# Patient Record
Sex: Male | Born: 1970 | Race: White | Hispanic: No | Marital: Married | Smoking: Never smoker
Health system: Southern US, Community
[De-identification: ages and names within clinical notes are randomized; demographics above are authoritative.]

## PROBLEM LIST (undated history)

## (undated) DIAGNOSIS — R030 Elevated blood-pressure reading, without diagnosis of hypertension: Secondary | ICD-10-CM

## (undated) DIAGNOSIS — U071 COVID-19: Secondary | ICD-10-CM

## (undated) DIAGNOSIS — N280 Ischemia and infarction of kidney: Secondary | ICD-10-CM

## (undated) HISTORY — PX: SHOULDER SURGERY: SHX246

## (undated) HISTORY — DX: COVID-19: U07.1

## (undated) HISTORY — DX: Ischemia and infarction of kidney: N28.0

## (undated) HISTORY — DX: Elevated blood-pressure reading, without diagnosis of hypertension: R03.0

---

## 2017-06-12 DIAGNOSIS — Z0000 Encounter for general adult medical examination without abnormal findings: Secondary | ICD-10-CM | POA: Diagnosis not present

## 2017-06-12 DIAGNOSIS — Z13220 Encounter for screening for lipoid disorders: Secondary | ICD-10-CM | POA: Diagnosis not present

## 2017-06-28 ENCOUNTER — Emergency Department (HOSPITAL_COMMUNITY): Payer: 59

## 2017-06-28 ENCOUNTER — Encounter (HOSPITAL_COMMUNITY): Payer: Self-pay | Admitting: *Deleted

## 2017-06-28 ENCOUNTER — Other Ambulatory Visit: Payer: Self-pay

## 2017-06-28 ENCOUNTER — Observation Stay (HOSPITAL_COMMUNITY)
Admission: EM | Admit: 2017-06-28 | Discharge: 2017-06-30 | Disposition: A | Discharge status: Home / Self Care | Payer: 59 | Attending: Family Medicine | Admitting: Family Medicine

## 2017-06-28 DIAGNOSIS — N280 Ischemia and infarction of kidney: Principal | ICD-10-CM | POA: Insufficient documentation

## 2017-06-28 DIAGNOSIS — N182 Chronic kidney disease, stage 2 (mild): Secondary | ICD-10-CM | POA: Diagnosis not present

## 2017-06-28 DIAGNOSIS — I129 Hypertensive chronic kidney disease with stage 1 through stage 4 chronic kidney disease, or unspecified chronic kidney disease: Secondary | ICD-10-CM | POA: Diagnosis not present

## 2017-06-28 DIAGNOSIS — N179 Acute kidney failure, unspecified: Secondary | ICD-10-CM | POA: Diagnosis not present

## 2017-06-28 DIAGNOSIS — R51 Headache: Secondary | ICD-10-CM | POA: Diagnosis not present

## 2017-06-28 DIAGNOSIS — R109 Unspecified abdominal pain: Secondary | ICD-10-CM | POA: Diagnosis not present

## 2017-06-28 DIAGNOSIS — R1012 Left upper quadrant pain: Secondary | ICD-10-CM | POA: Diagnosis not present

## 2017-06-28 DIAGNOSIS — R519 Headache, unspecified: Secondary | ICD-10-CM | POA: Diagnosis present

## 2017-06-28 DIAGNOSIS — R1013 Epigastric pain: Secondary | ICD-10-CM | POA: Diagnosis not present

## 2017-06-28 DIAGNOSIS — Z79899 Other long term (current) drug therapy: Secondary | ICD-10-CM | POA: Insufficient documentation

## 2017-06-28 DIAGNOSIS — E872 Acidosis, unspecified: Secondary | ICD-10-CM | POA: Diagnosis present

## 2017-06-28 LAB — CBC WITH DIFFERENTIAL/PLATELET
ABS IMMATURE GRANULOCYTES: 0 10*3/uL (ref 0.0–0.1)
BASOS PCT: 1 %
Basophils Absolute: 0.1 10*3/uL (ref 0.0–0.1)
Eosinophils Absolute: 0 10*3/uL (ref 0.0–0.7)
Eosinophils Relative: 0 %
HEMATOCRIT: 42.2 % (ref 39.0–52.0)
HEMOGLOBIN: 14 g/dL (ref 13.0–17.0)
Immature Granulocytes: 0 %
LYMPHS ABS: 1.6 10*3/uL (ref 0.7–4.0)
LYMPHS PCT: 16 %
MCH: 29.7 pg (ref 26.0–34.0)
MCHC: 33.2 g/dL (ref 30.0–36.0)
MCV: 89.4 fL (ref 78.0–100.0)
MONO ABS: 1.1 10*3/uL — AB (ref 0.1–1.0)
MONOS PCT: 10 %
Neutro Abs: 7.6 10*3/uL (ref 1.7–7.7)
Neutrophils Relative %: 73 %
Platelets: 270 10*3/uL (ref 150–400)
RBC: 4.72 MIL/uL (ref 4.22–5.81)
RDW: 12.2 % (ref 11.5–15.5)
WBC: 10.4 10*3/uL (ref 4.0–10.5)

## 2017-06-28 LAB — COMPREHENSIVE METABOLIC PANEL
ALK PHOS: 56 U/L (ref 38–126)
ALT: 22 U/L (ref 17–63)
AST: 24 U/L (ref 15–41)
Albumin: 4.5 g/dL (ref 3.5–5.0)
Anion gap: 12 (ref 5–15)
BILIRUBIN TOTAL: 1.5 mg/dL — AB (ref 0.3–1.2)
BUN: 14 mg/dL (ref 6–20)
CALCIUM: 9.8 mg/dL (ref 8.9–10.3)
CO2: 24 mmol/L (ref 22–32)
CREATININE: 1.43 mg/dL — AB (ref 0.61–1.24)
Chloride: 102 mmol/L (ref 101–111)
GFR, EST NON AFRICAN AMERICAN: 57 mL/min — AB (ref >60)
Glucose, Bld: 115 mg/dL — ABNORMAL HIGH (ref 65–99)
Potassium: 3.9 mmol/L (ref 3.5–5.1)
Sodium: 138 mmol/L (ref 135–145)
Total Protein: 7.2 g/dL (ref 6.5–8.1)

## 2017-06-28 LAB — LIPASE, BLOOD: LIPASE: 25 U/L (ref 11–51)

## 2017-06-28 LAB — I-STAT CG4 LACTIC ACID, ED: Lactic Acid, Venous: 2.25 mmol/L (ref 0.5–1.9)

## 2017-06-28 LAB — I-STAT TROPONIN, ED: TROPONIN I, POC: 0 ng/mL (ref 0.00–0.08)

## 2017-06-28 MED ORDER — IOHEXOL 300 MG/ML  SOLN
100.0000 mL | Freq: Once | INTRAMUSCULAR | Status: AC | PRN
  Administered 2017-06-28: 100 mL via INTRAVENOUS

## 2017-06-28 MED ORDER — HYDROMORPHONE HCL 2 MG/ML IJ SOLN
1.0000 mg | Freq: Once | INTRAMUSCULAR | Status: AC
  Administered 2017-06-29: 1 mg via INTRAVENOUS
  Filled 2017-06-28: qty 1

## 2017-06-28 MED ORDER — MORPHINE SULFATE (PF) 4 MG/ML IV SOLN
4.0000 mg | Freq: Once | INTRAVENOUS | Status: AC
  Administered 2017-06-28: 4 mg via INTRAVENOUS
  Filled 2017-06-28: qty 1

## 2017-06-28 MED ORDER — HYDROMORPHONE HCL 2 MG/ML IJ SOLN
0.5000 mg | Freq: Once | INTRAMUSCULAR | Status: AC
  Administered 2017-06-28: 0.5 mg via INTRAVENOUS
  Filled 2017-06-28: qty 1

## 2017-06-28 MED ORDER — ONDANSETRON HCL 4 MG/2ML IJ SOLN
4.0000 mg | Freq: Once | INTRAMUSCULAR | Status: AC
  Administered 2017-06-28: 4 mg via INTRAVENOUS
  Filled 2017-06-28: qty 2

## 2017-06-28 NOTE — ED Triage Notes (Signed)
Left sided abdominal pain and left mid back pain associated with nausea. Pain worse to inspiration.

## 2017-06-28 NOTE — ED Notes (Signed)
Patient transported to CT 

## 2017-06-28 NOTE — ED Provider Notes (Signed)
Patient placed in Quick Look pathway, seen and evaluated   Chief Complaint: Epigastric abdominal pain  HPI:   Patient presents to the ED for evaluation of epigastric and left-sided upper abdominal pain which is been progressively worsening since yesterday has become very constant.  He does report he has developed some pain in the left mid back.  This pain is associated with nausea but no emesis or hematemesis, patient denies any diarrhea, melena or hematochezia.  Patient denies any chest pain or shortness of breath although does report inspiration makes his epigastric pain more severe.  Patient denies fevers or chills, no urinary symptoms.  He reports he was seen by his primary care doctor today who did lab work and gave GI cocktail which initially helped but did not last, but given that his pain was worsening recommended he come to the ED for continued evaluation.  Patient denies any history of previous abdominal surgeries.  Denies regular alcohol use or drug use.  ROS: + Abdominal pain, nausea  Physical Exam:   Gen: Patient appears very uncomfortable on exam  Neuro: Awake and Alert  Skin: Warm    Focused Exam: Focal tenderness over the epigastrium and left upper quadrant with guarding, no lower abdominal tenderness, no CVA tenderness bilaterally, heart with regular rate and rhythm, lungs clear to auscultation   Initiation of care has begun. The patient has been counseled on the process, plan, and necessity for staying for the completion/evaluation, and the remainder of the medical screening examination    Janet Berlin 06/28/17 2109    Blanchie Dessert, MD 06/30/17 2251

## 2017-06-29 ENCOUNTER — Encounter (HOSPITAL_COMMUNITY): Payer: Self-pay | Admitting: Internal Medicine

## 2017-06-29 ENCOUNTER — Observation Stay (HOSPITAL_COMMUNITY): Payer: 59

## 2017-06-29 DIAGNOSIS — N280 Ischemia and infarction of kidney: Secondary | ICD-10-CM | POA: Diagnosis present

## 2017-06-29 DIAGNOSIS — R51 Headache: Secondary | ICD-10-CM | POA: Diagnosis not present

## 2017-06-29 DIAGNOSIS — N179 Acute kidney failure, unspecified: Secondary | ICD-10-CM | POA: Diagnosis not present

## 2017-06-29 DIAGNOSIS — E872 Acidosis, unspecified: Secondary | ICD-10-CM | POA: Diagnosis present

## 2017-06-29 DIAGNOSIS — R519 Headache, unspecified: Secondary | ICD-10-CM | POA: Diagnosis present

## 2017-06-29 DIAGNOSIS — I10 Essential (primary) hypertension: Secondary | ICD-10-CM | POA: Diagnosis not present

## 2017-06-29 LAB — URINALYSIS, ROUTINE W REFLEX MICROSCOPIC
Bilirubin Urine: NEGATIVE
GLUCOSE, UA: NEGATIVE mg/dL
KETONES UR: 80 mg/dL — AB
Leukocytes, UA: NEGATIVE
NITRITE: NEGATIVE
PH: 7 (ref 5.0–8.0)
PROTEIN: NEGATIVE mg/dL
Specific Gravity, Urine: 1.019 (ref 1.005–1.030)

## 2017-06-29 LAB — LACTIC ACID, PLASMA: Lactic Acid, Venous: 0.7 mmol/L (ref 0.5–1.9)

## 2017-06-29 LAB — BASIC METABOLIC PANEL
ANION GAP: 9 (ref 5–15)
BUN: 12 mg/dL (ref 6–20)
CALCIUM: 8.5 mg/dL — AB (ref 8.9–10.3)
CHLORIDE: 103 mmol/L (ref 101–111)
CO2: 24 mmol/L (ref 22–32)
Creatinine, Ser: 1.2 mg/dL (ref 0.61–1.24)
GFR calc non Af Amer: >60 mL/min (ref >60)
Glucose, Bld: 99 mg/dL (ref 65–99)
Potassium: 4 mmol/L (ref 3.5–5.1)
Sodium: 136 mmol/L (ref 135–145)

## 2017-06-29 LAB — CBC
HEMATOCRIT: 37.9 % — AB (ref 39.0–52.0)
Hemoglobin: 12.5 g/dL — ABNORMAL LOW (ref 13.0–17.0)
MCH: 29.5 pg (ref 26.0–34.0)
MCHC: 33 g/dL (ref 30.0–36.0)
MCV: 89.4 fL (ref 78.0–100.0)
PLATELETS: 214 10*3/uL (ref 150–400)
RBC: 4.24 MIL/uL (ref 4.22–5.81)
RDW: 12.3 % (ref 11.5–15.5)
WBC: 9.6 10*3/uL (ref 4.0–10.5)

## 2017-06-29 LAB — RAPID URINE DRUG SCREEN, HOSP PERFORMED
AMPHETAMINES: NOT DETECTED
BENZODIAZEPINES: NOT DETECTED
Barbiturates: NOT DETECTED
COCAINE: NOT DETECTED
OPIATES: POSITIVE — AB
TETRAHYDROCANNABINOL: NOT DETECTED

## 2017-06-29 LAB — PROTIME-INR
INR: 1.04
Prothrombin Time: 13.5 seconds (ref 11.4–15.2)

## 2017-06-29 LAB — SEDIMENTATION RATE: Sed Rate: 11 mm/hr (ref 0–16)

## 2017-06-29 LAB — I-STAT CG4 LACTIC ACID, ED: Lactic Acid, Venous: 0.9 mmol/L (ref 0.5–1.9)

## 2017-06-29 LAB — HIV ANTIBODY (ROUTINE TESTING W REFLEX): HIV Screen 4th Generation wRfx: NONREACTIVE

## 2017-06-29 LAB — HEPARIN LEVEL (UNFRACTIONATED)
Heparin Unfractionated: 0.29 IU/mL — ABNORMAL LOW (ref 0.30–0.70)
Heparin Unfractionated: 0.52 IU/mL (ref 0.30–0.70)

## 2017-06-29 LAB — APTT: APTT: 31 s (ref 24–36)

## 2017-06-29 LAB — TSH: TSH: 1.459 u[IU]/mL (ref 0.350–4.500)

## 2017-06-29 MED ORDER — OXYCODONE-ACETAMINOPHEN 5-325 MG PO TABS
1.0000 | ORAL_TABLET | Freq: Four times a day (QID) | ORAL | Status: DC | PRN
  Administered 2017-06-29 (×2): 2 via ORAL
  Filled 2017-06-29 (×2): qty 2

## 2017-06-29 MED ORDER — OXYCODONE-ACETAMINOPHEN 5-325 MG PO TABS: 1.0000 | ORAL_TABLET | Freq: Four times a day (QID) | ORAL | Status: DC | PRN

## 2017-06-29 MED ORDER — HYDROMORPHONE HCL 1 MG/ML IJ SOLN
1.0000 mg | Freq: Once | INTRAMUSCULAR | Status: AC
  Administered 2017-06-29: 1 mg via INTRAVENOUS
  Filled 2017-06-29: qty 1

## 2017-06-29 MED ORDER — NALOXONE HCL 0.4 MG/ML IJ SOLN: 0.4000 mg | INTRAMUSCULAR | Status: DC | PRN

## 2017-06-29 MED ORDER — HYDROMORPHONE HCL 2 MG/ML IJ SOLN
0.5000 mg | INTRAMUSCULAR | Status: DC | PRN
  Administered 2017-06-29 (×2): 0.5 mg via INTRAVENOUS
  Filled 2017-06-29 (×2): qty 1

## 2017-06-29 MED ORDER — DOCUSATE SODIUM 100 MG PO CAPS
100.0000 mg | ORAL_CAPSULE | Freq: Two times a day (BID) | ORAL | Status: DC
  Administered 2017-06-29 – 2017-06-30 (×3): 100 mg via ORAL
  Filled 2017-06-29 (×3): qty 1

## 2017-06-29 MED ORDER — SODIUM CHLORIDE 0.9 % IV SOLN
INTRAVENOUS | Status: DC
  Administered 2017-06-29 – 2017-06-30 (×2): via INTRAVENOUS

## 2017-06-29 MED ORDER — PANTOPRAZOLE SODIUM 40 MG PO TBEC
40.0000 mg | DELAYED_RELEASE_TABLET | Freq: Every day | ORAL | Status: DC
  Administered 2017-06-29 – 2017-06-30 (×2): 40 mg via ORAL
  Filled 2017-06-29 (×3): qty 1

## 2017-06-29 MED ORDER — HYDROMORPHONE HCL 1 MG/ML IJ SOLN
0.5000 mg | INTRAMUSCULAR | Status: DC | PRN
  Administered 2017-06-29 (×3): 0.5 mg via INTRAVENOUS
  Filled 2017-06-29 (×3): qty 1

## 2017-06-29 MED ORDER — ACETAMINOPHEN 325 MG PO TABS
650.0000 mg | ORAL_TABLET | Freq: Four times a day (QID) | ORAL | Status: DC | PRN
  Filled 2017-06-29: qty 2

## 2017-06-29 MED ORDER — OXYCODONE-ACETAMINOPHEN 5-325 MG PO TABS
1.0000 | ORAL_TABLET | Freq: Four times a day (QID) | ORAL | Status: DC | PRN
  Administered 2017-06-29: 1 via ORAL
  Filled 2017-06-29: qty 1

## 2017-06-29 MED ORDER — ONDANSETRON HCL 4 MG PO TABS: 4.0000 mg | ORAL_TABLET | Freq: Four times a day (QID) | ORAL | Status: DC | PRN

## 2017-06-29 MED ORDER — SUCRALFATE 1 G PO TABS: 1.0000 g | ORAL_TABLET | Freq: Three times a day (TID) | ORAL | Status: DC

## 2017-06-29 MED ORDER — ALBUTEROL SULFATE (2.5 MG/3ML) 0.083% IN NEBU: 2.5000 mg | INHALATION_SOLUTION | RESPIRATORY_TRACT | Status: DC | PRN

## 2017-06-29 MED ORDER — HYDROMORPHONE 1 MG/ML IV SOLN
INTRAVENOUS | Status: DC
  Filled 2017-06-29: qty 25

## 2017-06-29 MED ORDER — SODIUM CHLORIDE 0.9 % IV BOLUS
1000.0000 mL | Freq: Once | INTRAVENOUS | Status: AC
  Administered 2017-06-29: 1000 mL via INTRAVENOUS

## 2017-06-29 MED ORDER — HYDROMORPHONE HCL 1 MG/ML IJ SOLN: 1.0000 mg | INTRAMUSCULAR | Status: DC | PRN

## 2017-06-29 MED ORDER — DIPHENHYDRAMINE HCL 50 MG/ML IJ SOLN: 12.5000 mg | Freq: Four times a day (QID) | INTRAMUSCULAR | Status: DC | PRN

## 2017-06-29 MED ORDER — HEPARIN (PORCINE) IN NACL 100-0.45 UNIT/ML-% IJ SOLN
1600.0000 [IU]/h | INTRAMUSCULAR | Status: AC
  Administered 2017-06-29: 1400 [IU]/h via INTRAVENOUS
  Administered 2017-06-29 – 2017-06-30 (×2): 1600 [IU]/h via INTRAVENOUS
  Filled 2017-06-29: qty 250

## 2017-06-29 MED ORDER — OXYCODONE-ACETAMINOPHEN 5-325 MG PO TABS
1.0000 | ORAL_TABLET | ORAL | Status: DC | PRN
  Administered 2017-06-30: 2 via ORAL
  Filled 2017-06-29: qty 2

## 2017-06-29 MED ORDER — DICYCLOMINE HCL 20 MG PO TABS: 20.0000 mg | ORAL_TABLET | Freq: Three times a day (TID) | ORAL | Status: DC | PRN

## 2017-06-29 MED ORDER — HEPARIN BOLUS VIA INFUSION
4000.0000 [IU] | Freq: Once | INTRAVENOUS | Status: AC
  Administered 2017-06-29: 4000 [IU] via INTRAVENOUS
  Filled 2017-06-29: qty 4000

## 2017-06-29 MED ORDER — DIPHENHYDRAMINE HCL 12.5 MG/5ML PO ELIX: 12.5000 mg | ORAL_SOLUTION | Freq: Four times a day (QID) | ORAL | Status: DC | PRN

## 2017-06-29 MED ORDER — ONDANSETRON HCL 4 MG/2ML IJ SOLN
4.0000 mg | Freq: Four times a day (QID) | INTRAMUSCULAR | Status: DC | PRN
  Administered 2017-06-29 (×2): 4 mg via INTRAVENOUS
  Filled 2017-06-29 (×2): qty 2

## 2017-06-29 MED ORDER — HYDRALAZINE HCL 20 MG/ML IJ SOLN: 10.0000 mg | INTRAMUSCULAR | Status: DC | PRN

## 2017-06-29 MED ORDER — ACETAMINOPHEN 650 MG RE SUPP: 650.0000 mg | Freq: Four times a day (QID) | RECTAL | Status: DC | PRN

## 2017-06-29 MED ORDER — SODIUM CHLORIDE 0.9% FLUSH: 9.0000 mL | INTRAVENOUS | Status: DC | PRN

## 2017-06-29 NOTE — ED Provider Notes (Signed)
[facility] EMERGENCY DEPARTMENT Provider Note   CSN: [id number] Arrival date & time: 06/28/17  1937     History   Chief Complaint Chief Complaint  Patient presents with  . Abdominal Pain    HPI Glen Rollins is a 47 y.o. male.  This patient is a 47 year old male with no significant past medical history.  He presents today for evaluation of left-sided abdominal and flank pain that started abruptly earlier this afternoon.  He describes significant discomfort in these areas that causes him to feel nauseated, but has not vomited.  He denies any fevers or chills.  He denies any bowel or bladder complaints.  He was initially seen at urgent care and told he might have reflux or gastritis.  He was prescribed antacids, however his pain has worsened.  He was advised by urgent care to come to the ER for further work-up.  The history is provided by the patient.  Abdominal Pain   This is a new problem. The current episode started 6 to 12 hours ago. The problem occurs constantly. The problem has been rapidly worsening. The pain is associated with an unknown factor. The pain is located in the LUQ (Left flank). The quality of the pain is cramping. The pain is severe. Pertinent negatives include fever, melena, constipation and dysuria. Nothing aggravates the symptoms. Nothing relieves the symptoms.    History reviewed. No pertinent past medical history.  Patient Active Problem List   Diagnosis Date Noted  . 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 SHOULDER SURGERY          Home Medications    Prior to Admission medications   Medication Sig Start Date End Date Taking? Authorizing Provider  dicyclomine (BENTYL) 20 MG tablet Take 20 mg by mouth 3 (three) times daily as needed for spasms.   Yes [provider]  HYDROcodone-acetaminophen (NORCO/VICODIN) 5-325 MG tablet Take 1 tablet by mouth every 6 (six) hours  as needed for moderate pain.   Yes [provider]  omeprazole (PRILOSEC) 20 MG capsule Take 40 mg by mouth daily.   Yes [provider]  sucralfate (CARAFATE) 1 g tablet Take 1 g by mouth 4 (four) times daily.   Yes [provider]    Family History Family History  Problem Relation Age of Onset  . Lupus Mother   . Aortic stenosis Brother   . Arrhythmia Brother        possible VT    Social History Social History   Tobacco Use  . Smoking status: Never Smoker  Substance Use Topics  . Alcohol use: Yes  . Drug use: Never     Allergies   Patient has no known allergies.   Review of Systems Review of Systems  Constitutional: Negative for fever.  Gastrointestinal: Positive for abdominal pain. Negative for constipation and melena.  Genitourinary: Negative for dysuria.  All other systems reviewed and are negative.    Physical Exam Updated Vital Signs BP (!) 163/106 (BP Location: Left Arm)   Pulse 70   Temp 98.4 F (36.9 C) (Oral)   Resp 16   Ht 5\' 10"  (1.778 m)   Wt 90.7 kg (200 lb)   SpO2 97%   BMI 28.70 kg/m   Physical Exam  Constitutional: He is oriented to person, place, and time. He appears well-developed and well-nourished. No distress.  HENT:  Head: Normocephalic and atraumatic.  Mouth/Throat: Oropharynx is clear  and moist.  Neck: Normal range of motion. Neck supple.  Cardiovascular: Normal rate and regular rhythm. Exam reveals no friction rub.  No murmur heard. Pulmonary/Chest: Effort normal and breath sounds normal. No respiratory distress. He has no wheezes. He has no rales.  Abdominal: Soft. Bowel sounds are normal. He exhibits no distension. There is tenderness in the left upper quadrant. There is no rigidity, no rebound and no guarding.  There is tenderness to palpation in the left upper quadrant and left flank.  Musculoskeletal: Normal range of motion. He exhibits no edema.  Neurological: He is alert and oriented to person,  place, and time. Coordination normal.  Skin: Skin is warm and dry. He is not diaphoretic.  Nursing note and vitals reviewed.    ED Treatments / Results  Labs (all labs ordered are listed, but only abnormal results are displayed) Labs Reviewed  CBC WITH DIFFERENTIAL/PLATELET - Abnormal; Notable for the following components:      Result Value   Monocytes Absolute 1.1 (*)    All other components within normal limits  COMPREHENSIVE METABOLIC PANEL - Abnormal; Notable for the following components:   Glucose, Bld 115 (*)    Creatinine, Ser 1.43 (*)    Total Bilirubin 1.5 (*)    GFR calc non Af Amer 57 (*)    All other components within normal limits  RAPID URINE DRUG SCREEN, HOSP PERFORMED - Abnormal; Notable for the following components:   Opiates POSITIVE (*)    All other components within normal limits  I-STAT CG4 LACTIC ACID, ED - Abnormal; Notable for the following components:   Lactic Acid, Venous 2.25 (*)    All other components within normal limits  CULTURE, BLOOD (ROUTINE X 2)  CULTURE, BLOOD (ROUTINE X 2)  LIPASE, BLOOD  PROTIME-INR  APTT  LACTIC ACID, PLASMA  URINALYSIS, ROUTINE W REFLEX MICROSCOPIC  HEPARIN LEVEL (UNFRACTIONATED)  CBC  HIV ANTIBODY (ROUTINE TESTING)  BASIC METABOLIC PANEL  LACTIC ACID, PLASMA  TSH  I-STAT TROPONIN, ED  I-STAT CG4 LACTIC ACID, ED    EKG EKG Interpretation  Date/Time:  Thursday June 28 2017 20:01:13 EDT Ventricular Rate:  65 PR Interval:  118 QRS Duration: 94 QT Interval:  398 QTC Calculation: 413 R Axis:   82 Text Interpretation:  Normal sinus rhythm with sinus arrhythmia Normal ECG Confirmed by Veryl Speak [phone]) on 06/28/2017 11:32:02 PM   Radiology Ct Head Wo Contrast  Result Date: 06/29/2017 CLINICAL DATA:  Thunderclap headache EXAM: CT HEAD WITHOUT CONTRAST TECHNIQUE: Contiguous axial images were obtained from the base of the skull through the vertex without intravenous contrast. COMPARISON:  None. FINDINGS: Brain:  There is no mass, hemorrhage or extra-axial collection. The size and configuration of the ventricles and extra-axial CSF spaces are normal. There is no acute or chronic infarction. The brain parenchyma is normal. Vascular: No abnormal hyperdensity of the major intracranial arteries or dural venous sinuses. No intracranial atherosclerosis. Skull: The visualized skull base, calvarium and extracranial soft tissues are normal. Sinuses/Orbits: No fluid levels or advanced mucosal thickening of the visualized paranasal sinuses. No mastoid or middle ear effusion. The orbits are normal. IMPRESSION: Normal head CT. Electronically Signed   By: Ulyses Jarred M.D.   On: 06/29/2017 03:13   Ct Abdomen Pelvis W Contrast  Result Date: 06/28/2017 CLINICAL DATA:  Acute onset of left-sided abdominal pain, and left mid back pain. Nausea. EXAM: CT ABDOMEN AND PELVIS WITH CONTRAST TECHNIQUE: Multidetector CT imaging of the abdomen and pelvis was performed using  the standard protocol following bolus administration of intravenous contrast. CONTRAST:  112mL OMNIPAQUE IOHEXOL 300 MG/ML  SOLN COMPARISON:  None. FINDINGS: Lower chest: The visualized lung bases are grossly clear. The visualized portions of the mediastinum are unremarkable. Hepatobiliary: The liver is unremarkable in appearance. The gallbladder is unremarkable in appearance. The common bile duct remains normal in caliber. Pancreas: The pancreas is within normal limits. Spleen: The spleen is unremarkable in appearance. Adrenals/Urinary Tract: The adrenal glands are unremarkable in appearance. There appears to be acute infarct involving approximately half of the left renal parenchyma, with visible occlusion of 2 of the branches of the left renal artery on coronal images. This is concerning for an embolic event from an unknown source. The renal arteries are otherwise unremarkable in appearance. There is no definite evidence for fibromuscular dysplasia or renal artery dissection.  There is no evidence of hydronephrosis. No renal or ureteral stones are identified. No perinephric stranding is seen. The right kidney is grossly unremarkable in appearance. Stomach/Bowel: The stomach is unremarkable in appearance. The small bowel is within normal limits. The appendix is normal in caliber, without evidence of appendicitis. The colon is unremarkable in appearance. Vascular/Lymphatic: The abdominal aorta is unremarkable in appearance. The inferior vena cava is grossly unremarkable. No retroperitoneal lymphadenopathy is seen. No pelvic sidewall lymphadenopathy is identified. Reproductive: The bladder is mildly distended and grossly unremarkable. The prostate is normal in size. Other: No additional soft tissue abnormalities are seen. Musculoskeletal: No acute osseous abnormalities are identified. The visualized musculature is unremarkable in appearance. IMPRESSION: 1. Acute infarct involving approximately half of the left renal parenchyma, with visible occlusion of 2 of the branches of the left renal artery on coronal images. This is concerning for an embolic event from an unknown source. Would correlate for underlying coagulopathic condition. Echocardiography could be considered to exclude underlying thrombus, though the heart is grossly unremarkable in appearance on CT. 2. No definite evidence for fibromuscular dysplasia or renal artery dissection. The renal arteries are otherwise unremarkable in appearance. These results were called by telephone at the time of interpretation on 06/28/2017 at 10:35 pm to Dr. Melina Copa, who verbally acknowledged these results. Electronically Signed   By: Garald Balding M.D.   On: 06/28/2017 22:41    Procedures Procedures (including critical care time)  Medications Ordered in ED Medications  heparin ADULT infusion 100 units/mL (25000 units/274mL sodium chloride 0.45%) (1,400 Units/hr Intravenous New Bag/Given 06/29/17 0052)  pantoprazole (PROTONIX) EC tablet 40 mg  (has no administration in time range)  hydrALAZINE (APRESOLINE) injection 10 mg (has no administration in time range)  0.9 %  sodium chloride infusion ( Intravenous New Bag/Given 06/29/17 0322)  acetaminophen (TYLENOL) tablet 650 mg (has no administration in time range)    Or  acetaminophen (TYLENOL) suppository 650 mg (has no administration in time range)  ondansetron (ZOFRAN) tablet 4 mg (has no administration in time range)    Or  ondansetron (ZOFRAN) injection 4 mg (has no administration in time range)  albuterol (PROVENTIL) (2.5 MG/3ML) 0.083% nebulizer solution 2.5 mg (has no administration in time range)  HYDROmorphone (DILAUDID) injection 0.5 mg (0.5 mg Intravenous Given 06/29/17 0158)  oxyCODONE-acetaminophen (PERCOCET/ROXICET) 5-325 MG per tablet 1 tablet (has no administration in time range)  ondansetron (ZOFRAN) injection 4 mg (4 mg Intravenous Given 06/28/17 2043)  morphine 4 MG/ML injection 4 mg (4 mg Intravenous Given 06/28/17 2043)  HYDROmorphone (DILAUDID) injection 0.5 mg (0.5 mg Intravenous Given 06/28/17 2150)  iohexol (OMNIPAQUE) 300 MG/ML solution 100 mL (  100 mLs Intravenous Contrast Given 06/28/17 2159)  HYDROmorphone (DILAUDID) injection 1 mg (1 mg Intravenous Given 06/29/17 0000)  heparin bolus via infusion 4,000 Units (4,000 Units Intravenous Bolus from Bag 06/29/17 0052)     Initial Impression / Assessment and Plan / ED Course  I have reviewed the triage vital signs and the nursing notes.  Pertinent labs & imaging results that were available during my care of the patient were reviewed by me and considered in my medical decision making (see chart for details).  Laboratory studies are reassuring, but CT scan shows acute infarct involving half of the left renal parenchyma with what appears to be clot within branches of the renal artery.  This is consistent with an embolic phenomenon, the etiology of which I am uncertain.  I have discussed this case with Dr. Tamala Julian from the  hospitalist service who agrees to admit.  Hypercoagulability work-up was initiated, then the patient was started on heparin.  Final Clinical Impressions(s) / ED Diagnoses   Final diagnoses:  None    ED Discharge Orders    None       Veryl Speak, MD 06/29/17 [phone]

## 2017-06-29 NOTE — ED Notes (Signed)
Pt asking when next pain meds due, advised dilaudid is q3 and last dose was at 0811. Pt also asked if pain meds and nausea meds could be given simultaneously. This RN inquired if patient needed nausea meds at this time, pt stated he had just vomited but did not need nausea meds at this time

## 2017-06-29 NOTE — Progress Notes (Signed)
ANTICOAGULATION CONSULT NOTE  Pharmacy Consult for Heparin Indication: renal infarction  No Known Allergies  Patient Measurements: Height: 5\' 10"  (177.8 cm) Weight: 200 lb (90.7 kg) IBW/kg (Calculated) : 73  Vital Signs: Temp: 98.4 F (36.9 C) (06/06 2243) Temp Source: Oral (06/06 2243) BP: 151/96 (06/07 0700) Pulse Rate: 70 (06/07 0312)  Labs: Recent Labs    06/28/17 2040 06/29/17 0020 06/29/17 0800  HGB 14.0  --  12.5*  HCT 42.2  --  37.9*  PLT 270  --  214  APTT  --  31  --   LABPROT  --  13.5  --   INR  --  1.04  --   HEPARINUNFRC  --   --  0.29*  CREATININE 1.43*  --  1.20    Estimated Creatinine Clearance: 87.1 mL/min (by C-G formula based on SCr of 1.2 mg/dL).   Assessment: 47 y.o. male with abdominal pain, found to have L renal artery occlusion continues on IV heparin. Initial heparin level is slightly low. No bleeding noted.   Goal of Therapy:  Heparin level 0.3-0.7 units/ml Monitor platelets by anticoagulation protocol: Yes   Plan:  Increase heparin gtt to 1600 units/hr Check a PM heparin level Daily heparin level and CBC  Edrees Valent, Rande Lawman 06/29/2017,9:43 AM

## 2017-06-29 NOTE — ED Notes (Signed)
Lunch tray ordered 

## 2017-06-29 NOTE — Progress Notes (Signed)
Patient ID: Glen Rollins, male   DOB: 1970-04-21, 47 y.o.   MRN: [id number]  PROGRESS NOTE    Glen Rollins  UXL:[id number] DOB: 1970-01-28 DOA: 06/28/2017 PCP: [name], MD   Brief Narrative:  47 year old healthy male presented to the emergency department with abdominal pain found to have significant infarct left kidney.  Patient admitted on heparin drip.   Assessment & Plan:   Principal Problem:   Renal infarct [facility]) Active Problems:   Lactic acidosis   AKI (acute kidney injury) ([place])   New onset of headaches   Left renal infarct-with mild acute kidney injury with a creatinine of 1.4.  This is resolving.  Urinalysis has been ordered last night but is still pending.  I have reordered a urinalysis.  Obtain nephrology consultation with Dr. Posey Pronto.  Blood cultures pending continue heparin drip.  We will transition to oral anticoagulation tomorrow likely Xarelto or Eliquis if echo normal.  Acute kidney injury-resolved with IV fluids and heparin secondary to acute thrombus  Mild lactic acidosis-resolved  Headaches-denies at this time.  CT brain normal.   DVT prophylaxis: Heparin drip  Code Status: Full code  Family Communication: Patient himself who is coherent and cognitively intact  Disposition Plan: Likely tomorrow   Consultants:   Nephrology   Subjective: Still with some flank pain.  Dilaudid is helping though.  Taking some Percocet.  Objective: Vitals:   06/29/17 0700 06/29/17 1000 06/29/17 1100 06/29/17 1154  BP: (!) 151/96 (!) 141/95 (!) 145/89 (!) 147/104  Pulse:    78  Resp:    18  Temp:      TempSrc:      SpO2:    98%  Weight:      Height:       No intake or output data in the 24 hours ending 06/29/17 1253 Filed Weights   06/28/17 2027  Weight: 90.7 kg (200 lb)    Examination:  General exam: Appears calm and comfortable  Respiratory system: Clear to auscultation. Respiratory effort normal. Cardiovascular system: S1 & S2 heard,  RRR. No JVD, murmurs, rubs, gallops or clicks. No pedal edema. Gastrointestinal system: Abdomen is nondistended, soft and nontender. No organomegaly or masses felt. Normal bowel sounds heard. Central nervous system: Alert and oriented. No focal neurological deficits. Extremities: Symmetric 5 x 5 power. Skin: No rashes, lesions or ulcers Psychiatry: Judgement and insight appear normal. Mood & affect appropriate.     Data Reviewed: I have personally reviewed following labs and imaging studies  CBC: Recent Labs  Lab 06/28/17 2040 06/29/17 0800  WBC 10.4 9.6  NEUTROABS 7.6  --   HGB 14.0 12.5*  HCT 42.2 37.9*  MCV 89.4 89.4  PLT 270 536   Basic Metabolic Panel: Recent Labs  Lab 06/28/17 2040 06/29/17 0800  NA 138 136  K 3.9 4.0  CL 102 103  CO2 24 24  GLUCOSE 115* 99  BUN 14 12  CREATININE 1.43* 1.20  CALCIUM 9.8 8.5*   GFR: Estimated Creatinine Clearance: 87.1 mL/min (by C-G formula based on SCr of 1.2 mg/dL). Liver Function Tests: Recent Labs  Lab 06/28/17 2040  AST 24  ALT 22  ALKPHOS 56  BILITOT 1.5*  PROT 7.2  ALBUMIN 4.5   Recent Labs  Lab 06/28/17 2040  LIPASE 25   No results for input(s): AMMONIA in the last 168 hours. Coagulation Profile: Recent Labs  Lab 06/29/17 0020  INR 1.04   Cardiac Enzymes: No results for input(s): CKTOTAL, CKMB, CKMBINDEX,  TROPONINI in the last 168 hours. BNP (last 3 results) No results for input(s): PROBNP in the last 8760 hours. HbA1C: No results for input(s): HGBA1C in the last 72 hours. CBG: No results for input(s): GLUCAP in the last 168 hours. Lipid Profile: No results for input(s): CHOL, HDL, LDLCALC, TRIG, CHOLHDL, LDLDIRECT in the last 72 hours. Thyroid Function Tests: Recent Labs    06/29/17 0335  TSH 1.459   Anemia Panel: No results for input(s): VITAMINB12, FOLATE, FERRITIN, TIBC, IRON, RETICCTPCT in the last 72 hours. Sepsis Labs: Recent Labs  Lab 06/28/17 2110 06/29/17 0031 06/29/17 0335    LATICACIDVEN 2.25* 0.90 0.7    No results found for this or any previous visit (from the past 240 hour(s)).       Radiology Studies: Ct Head Wo Contrast  Result Date: 06/29/2017 CLINICAL DATA:  Thunderclap headache EXAM: CT HEAD WITHOUT CONTRAST TECHNIQUE: Contiguous axial images were obtained from the base of the skull through the vertex without intravenous contrast. COMPARISON:  None. FINDINGS: Brain: There is no mass, hemorrhage or extra-axial collection. The size and configuration of the ventricles and extra-axial CSF spaces are normal. There is no acute or chronic infarction. The brain parenchyma is normal. Vascular: No abnormal hyperdensity of the major intracranial arteries or dural venous sinuses. No intracranial atherosclerosis. Skull: The visualized skull base, calvarium and extracranial soft tissues are normal. Sinuses/Orbits: No fluid levels or advanced mucosal thickening of the visualized paranasal sinuses. No mastoid or middle ear effusion. The orbits are normal. IMPRESSION: Normal head CT. Electronically Signed   By: Ulyses Jarred M.D.   On: 06/29/2017 03:13   Ct Abdomen Pelvis W Contrast  Result Date: 06/28/2017 CLINICAL DATA:  Acute onset of left-sided abdominal pain, and left mid back pain. Nausea. EXAM: CT ABDOMEN AND PELVIS WITH CONTRAST TECHNIQUE: Multidetector CT imaging of the abdomen and pelvis was performed using the standard protocol following bolus administration of intravenous contrast. CONTRAST:  151mL OMNIPAQUE IOHEXOL 300 MG/ML  SOLN COMPARISON:  None. FINDINGS: Lower chest: The visualized lung bases are grossly clear. The visualized portions of the mediastinum are unremarkable. Hepatobiliary: The liver is unremarkable in appearance. The gallbladder is unremarkable in appearance. The common bile duct remains normal in caliber. Pancreas: The pancreas is within normal limits. Spleen: The spleen is unremarkable in appearance. Adrenals/Urinary Tract: The adrenal glands are  unremarkable in appearance. There appears to be acute infarct involving approximately half of the left renal parenchyma, with visible occlusion of 2 of the branches of the left renal artery on coronal images. This is concerning for an embolic event from an unknown source. The renal arteries are otherwise unremarkable in appearance. There is no definite evidence for fibromuscular dysplasia or renal artery dissection. There is no evidence of hydronephrosis. No renal or ureteral stones are identified. No perinephric stranding is seen. The right kidney is grossly unremarkable in appearance. Stomach/Bowel: The stomach is unremarkable in appearance. The small bowel is within normal limits. The appendix is normal in caliber, without evidence of appendicitis. The colon is unremarkable in appearance. Vascular/Lymphatic: The abdominal aorta is unremarkable in appearance. The inferior vena cava is grossly unremarkable. No retroperitoneal lymphadenopathy is seen. No pelvic sidewall lymphadenopathy is identified. Reproductive: The bladder is mildly distended and grossly unremarkable. The prostate is normal in size. Other: No additional soft tissue abnormalities are seen. Musculoskeletal: No acute osseous abnormalities are identified. The visualized musculature is unremarkable in appearance. IMPRESSION: 1. Acute infarct involving approximately half of the left renal parenchyma, with  visible occlusion of 2 of the branches of the left renal artery on coronal images. This is concerning for an embolic event from an unknown source. Would correlate for underlying coagulopathic condition. Echocardiography could be considered to exclude underlying thrombus, though the heart is grossly unremarkable in appearance on CT. 2. No definite evidence for fibromuscular dysplasia or renal artery dissection. The renal arteries are otherwise unremarkable in appearance. These results were called by telephone at the time of interpretation on 06/28/2017  at 10:35 pm to Dr. Melina Copa, who verbally acknowledged these results. Electronically Signed   By: Garald Balding M.D.   On: 06/28/2017 22:41        Scheduled Meds: . pantoprazole  40 mg Oral Daily   Continuous Infusions: . sodium chloride 75 mL/hr at 06/29/17 0322  . heparin 1,600 Units/hr (06/29/17 1006)     LOS: 0 days    Time spent: 34 minutes    Jenet Durio A, MD [facility] Pager [phone]  If 7PM-7AM, please contact night-coverage [email or web address] Password [facility] 06/29/2017, 12:53 PM

## 2017-06-29 NOTE — Consult Note (Signed)
Reason for Consult: Renal infarct, acute kidney injury Referring Physician: Derrill Kay, MD [facility])  HPI:  47 year old Caucasian man with no significant past medical history who presents with a 2-week complaint of increasing left upper quadrant pain/left flank pain that was initially suspected and treated as an episode of gastritis with transient relief of symptoms.  He eventually presented to the emergency room with increasing pain and underwent CT angiogram of the abdomen/pelvis confirmed acute infarct involving approximately half of the left renal parenchyma with visible occlusion of 2 of the branches of the left renal artery raising concern for an embolic event versus hypercoagulable state.  No evidence of FMD or renal artery dissection were noted.  He reports some preceding long distance air travel returning back from [place] on 06/08/2017 but denies any associated pelvic pain/leg pain or swelling.  He denies any hematuria and has noted intermittent foamy urine.  He denies any chest pain or shortness of breath.  He denies any pedal edema or orthostatic dizziness and has had some recent nausea associated with this pain.  Denies any vomiting or diarrhea.  Reports sporadic use of NSAIDs.  Family history significant for lupus in his mother (she died from lung cancer) and significant for an autoimmune disorder in his brother who also suffers from aortic stenosis and V. tach.  History reviewed. No pertinent past medical history.  Past Surgical History:  Procedure Laterality Date  . SHOULDER SURGERY      Family History  Problem Relation Age of Onset  . Lupus Mother   . Aortic stenosis Brother   . Arrhythmia Brother        possible VT    Social History:  reports that he has never smoked. He does not have any smokeless tobacco history on file. He reports that he drinks alcohol. He reports that he does not use drugs.  Allergies: No Known Allergies  Medications:  Scheduled: . docusate sodium   100 mg Oral BID  . pantoprazole  40 mg Oral Daily    BMP Latest Ref Rng & Units 06/29/2017 06/28/2017  Glucose 65 - 99 mg/dL 99 115(H)  BUN 6 - 20 mg/dL 12 14  Creatinine 0.61 - 1.24 mg/dL 1.20 1.43(H)  Sodium 135 - 145 mmol/L 136 138  Potassium 3.5 - 5.1 mmol/L 4.0 3.9  Chloride 101 - 111 mmol/L 103 102  CO2 22 - 32 mmol/L 24 24  Calcium 8.9 - 10.3 mg/dL 8.5(L) 9.8   CBC Latest Ref Rng & Units 06/29/2017 06/28/2017  WBC 4.0 - 10.5 K/uL 9.6 10.4  Hemoglobin 13.0 - 17.0 g/dL 12.5(L) 14.0  Hematocrit 39.0 - 52.0 % 37.9(L) 42.2  Platelets 150 - 400 K/uL 214 270     Ct Head Wo Contrast  Result Date: 06/29/2017 CLINICAL DATA:  Thunderclap headache EXAM: CT HEAD WITHOUT CONTRAST TECHNIQUE: Contiguous axial images were obtained from the base of the skull through the vertex without intravenous contrast. COMPARISON:  None. FINDINGS: Brain: There is no mass, hemorrhage or extra-axial collection. The size and configuration of the ventricles and extra-axial CSF spaces are normal. There is no acute or chronic infarction. The brain parenchyma is normal. Vascular: No abnormal hyperdensity of the major intracranial arteries or dural venous sinuses. No intracranial atherosclerosis. Skull: The visualized skull base, calvarium and extracranial soft tissues are normal. Sinuses/Orbits: No fluid levels or advanced mucosal thickening of the visualized paranasal sinuses. No mastoid or middle ear effusion. The orbits are normal. IMPRESSION: Normal head CT. Electronically Signed   By:  Ulyses Jarred M.D.   On: 06/29/2017 03:13   Ct Abdomen Pelvis W Contrast  Result Date: 06/28/2017 CLINICAL DATA:  Acute onset of left-sided abdominal pain, and left mid back pain. Nausea. EXAM: CT ABDOMEN AND PELVIS WITH CONTRAST TECHNIQUE: Multidetector CT imaging of the abdomen and pelvis was performed using the standard protocol following bolus administration of intravenous contrast. CONTRAST:  172mL OMNIPAQUE IOHEXOL 300 MG/ML  SOLN  COMPARISON:  None. FINDINGS: Lower chest: The visualized lung bases are grossly clear. The visualized portions of the mediastinum are unremarkable. Hepatobiliary: The liver is unremarkable in appearance. The gallbladder is unremarkable in appearance. The common bile duct remains normal in caliber. Pancreas: The pancreas is within normal limits. Spleen: The spleen is unremarkable in appearance. Adrenals/Urinary Tract: The adrenal glands are unremarkable in appearance. There appears to be acute infarct involving approximately half of the left renal parenchyma, with visible occlusion of 2 of the branches of the left renal artery on coronal images. This is concerning for an embolic event from an unknown source. The renal arteries are otherwise unremarkable in appearance. There is no definite evidence for fibromuscular dysplasia or renal artery dissection. There is no evidence of hydronephrosis. No renal or ureteral stones are identified. No perinephric stranding is seen. The right kidney is grossly unremarkable in appearance. Stomach/Bowel: The stomach is unremarkable in appearance. The small bowel is within normal limits. The appendix is normal in caliber, without evidence of appendicitis. The colon is unremarkable in appearance. Vascular/Lymphatic: The abdominal aorta is unremarkable in appearance. The inferior vena cava is grossly unremarkable. No retroperitoneal lymphadenopathy is seen. No pelvic sidewall lymphadenopathy is identified. Reproductive: The bladder is mildly distended and grossly unremarkable. The prostate is normal in size. Other: No additional soft tissue abnormalities are seen. Musculoskeletal: No acute osseous abnormalities are identified. The visualized musculature is unremarkable in appearance. IMPRESSION: 1. Acute infarct involving approximately half of the left renal parenchyma, with visible occlusion of 2 of the branches of the left renal artery on coronal images. This is concerning for an  embolic event from an unknown source. Would correlate for underlying coagulopathic condition. Echocardiography could be considered to exclude underlying thrombus, though the heart is grossly unremarkable in appearance on CT. 2. No definite evidence for fibromuscular dysplasia or renal artery dissection. The renal arteries are otherwise unremarkable in appearance. These results were called by telephone at the time of interpretation on 06/28/2017 at 10:35 pm to Dr. Melina Copa, who verbally acknowledged these results. Electronically Signed   By: Garald Balding M.D.   On: 06/28/2017 22:41    Review of Systems  Constitutional: Positive for chills and malaise/fatigue. Negative for fever and weight loss.  HENT: Negative.   Eyes: Negative.   Respiratory: Negative.   Cardiovascular: Negative.   Gastrointestinal: Positive for abdominal pain, heartburn and nausea. Negative for constipation and diarrhea.  Genitourinary: Positive for flank pain. Negative for dysuria and hematuria.  Musculoskeletal: Positive for back pain. Negative for myalgias and neck pain.  Skin: Negative.   Neurological: Negative for dizziness, tremors and headaches.   Blood pressure (!) 147/104, pulse 78, temperature 98.4 F (36.9 C), temperature source Oral, resp. rate 18, height 5\' 10"  (1.778 m), weight 90.7 kg (200 lb), SpO2 98 %. Physical Exam  Nursing note and vitals reviewed. Constitutional: He is oriented to person, place, and time. He appears well-developed and well-nourished. He appears distressed.  Very uncomfortable, intermittently repositioning himself  HENT:  Head: Normocephalic and atraumatic.  Mouth/Throat: Oropharynx is clear and moist.  No oropharyngeal exudate.  Eyes: Pupils are equal, round, and reactive to light. Conjunctivae and EOM are normal. No scleral icterus.  Neck: Normal range of motion. Neck supple. No JVD present. No thyromegaly present.  Cardiovascular: Normal rate and regular rhythm.  No murmur  heard. Respiratory: Effort normal and breath sounds normal. No respiratory distress. He has no wheezes. He has no rales.  GI: There is tenderness. There is guarding.  Left upper quadrant tenderness  Musculoskeletal: Normal range of motion. He exhibits no edema.  Neurological: He is alert and oriented to person, place, and time.  Skin: Skin is warm and dry. No rash noted. No erythema.  Psychiatric: He has a normal mood and affect.    Assessment/Plan: 1.  Acute kidney injury: This appears to have been mediated by recent acute renal infarction and the hemodynamic effect of his decreased oral intake/nausea with abdominal pain.  Urinalysis significant for no detectable protein and a concentrated urine.  Surprisingly, no hematuria noted in spite of his renal infarction. 2.  Acute renal infarction: Unclear etiology, will evaluate for valvular heart disease/mural thrombus with echocardiogram as well as monitor him overnight with telemetry.  Will check for hypercoagulable states with antiphospholipid antibody as well as antinuclear antibody.  Started on anticoagulation with intravenous heparin drip and if echocardiogram does not show significant valvular disease-may be able to transition to a NOAC such as Xarelto or Eliquis.  Ongoing pain management per primary service. 3.  Hypertension: Appears to be situational secondary to pain, with ongoing pain management.  Melodie Ashworth K. 06/29/2017, 2:45 PM

## 2017-06-29 NOTE — Progress Notes (Signed)
ANTICOAGULATION CONSULT NOTE  Pharmacy Consult for Heparin Indication: renal infarction  No Known Allergies  Patient Measurements: Height: 5\' 10"  (177.8 cm) Weight: 200 lb (90.7 kg) IBW/kg (Calculated) : 73  Vital Signs: Temp: 100.5 F (38.1 C) (06/07 1620) Temp Source: Oral (06/07 1620) BP: 154/94 (06/07 1620) Pulse Rate: 73 (06/07 1620)  Labs: Recent Labs    06/28/17 2040 06/29/17 0020 06/29/17 0800 06/29/17 1724  HGB 14.0  --  12.5*  --   HCT 42.2  --  37.9*  --   PLT 270  --  214  --   APTT  --  31  --   --   LABPROT  --  13.5  --   --   INR  --  1.04  --   --   HEPARINUNFRC  --   --  0.29* 0.52  CREATININE 1.43*  --  1.20  --     Estimated Creatinine Clearance: 87.1 mL/min (by C-G formula based on SCr of 1.2 mg/dL).   Assessment: 47 y.o. male with abdominal pain, found to have L renal artery occlusion who continues on IV heparin.  Heparin level is therapeutic at 0.52 on 1600 units/hr. No bleeding noted. Plan is to transition to oral anticoagulation tomorrow if echo normal.  Goal of Therapy:  Heparin level 0.3-0.7 units/ml Monitor platelets by anticoagulation protocol: Yes   Plan:  Continue heparin drip at 1600 units/hr Confirmatory heparin level with am labs Daily heparin level and CBC Monitor for s/sx of bleeding   Renold Genta, PharmD, BCPS Clinical Pharmacist Clinical phone for 06/29/2017 until 10p is [phone] 06/29/2017 7:22 PM

## 2017-06-29 NOTE — Progress Notes (Signed)
Attempted to get report, nurse unavailable.

## 2017-06-29 NOTE — ED Notes (Signed)
Attempted report, nurse unavailable at this time.

## 2017-06-29 NOTE — ED Notes (Signed)
Pt resting.

## 2017-06-29 NOTE — H&P (Addendum)
History and Physical    Glen Rollins FYB:[id number] DOB: [birth year] DOA: 06/28/2017  Referring MD/NP/PA:  Dr. Veryl Speak PCP: [name], MD  Patient coming from: home  Chief Complaint: Abdominal pain  I have personally briefly reviewed patient's old medical records in [place]   HPI: Glen Rollins is a 47 y.o. male without significant past medical history; who presents with complaints of intermittent sharp left upper quadrant abdominal pain that initially started approximately 9 days ago.  Patient reports traveling a lot for work and reports most part domestic, but of note he had come back from [place] on 5/17.  On 5/21 he had establish care with his primary care and blood work at that time was noted to be within normal limits.  He initially thought the abdominal pain symptoms may have been related to acid reflux and took Tums and eventually with rest symptoms improved on their own few hours later.  Symptoms then seemed to return every few days or so and seem to be getting worse.  Pain would radiate to his back. He reports associated symptoms of having mild lower extremity swelling, neck stiffness, new severe headaches that he describes as " worst headache of his life with visual disturbances".  Patient was seen in urgent care yesterday and symptoms were thought possibly secondary to peptic ulcer.  He was prescribed Protonix, Carafate, and Bentyl.  She reports taking 1 dose of the medicines without relief of symptoms and called the urgent care back as he is not feeling any better.  They recommended him to come to the emergency department for further evaluation.  Family history is significant for lupus in his mother who died of lung cancer at the age of 60, and a brother with [place] some suspected autoimmune disorder resulting in him having aortic stenosis and ventricular tachycardia.  Patient denies any drug use, tobacco abuse, fever, confusion, loss of consciousness, chest  pain, shortness of breath, cough, or diarrhea.  Patient's wife notes that his urine appears foamy and questions if he has nephrotic syndrome.  ED Course: Upon admission into the emergency department patient was seen to be afebrile, pulse 65-1 03, respiration 1622, blood pressure 146/107 - 160/101, and O2 saturations maintained on room air.  Labs revealed WBC upper limit of normal at 10.4, BUN 14, creatinine 1.43, and lactic acid 2.25.  TRH called to admit.  Recommended checking UDS, APTT, PT/INR, and blood cultures prior to starting anticoagulation of heparin.  Patient given multiple rounds of pain medication while in the emergency department without relief of pain symptoms.  Review of Systems  Constitutional: Negative for chills and weight loss.  HENT: Negative for ear discharge and nosebleeds.   Eyes: Positive for blurred vision. Negative for photophobia.  Respiratory: Negative for sputum production and shortness of breath.   Cardiovascular: Positive for leg swelling (Mild). Negative for chest pain.  Gastrointestinal: Positive for abdominal pain. Negative for vomiting.  Genitourinary: Positive for flank pain. Negative for dysuria.  Musculoskeletal: Positive for myalgias and neck pain. Negative for falls.  Skin: Negative for rash.  Neurological: Positive for headaches. Negative for loss of consciousness.  Endo/Heme/Allergies: Negative for polydipsia. Does not bruise/bleed easily.  Psychiatric/Behavioral: Negative for substance abuse and suicidal ideas.    History reviewed. No pertinent past medical history.  Past Surgical History:  Procedure Laterality Date  . SHOULDER SURGERY       reports that he has never smoked. He does not have any smokeless tobacco history on file.  He reports that he drinks alcohol. He reports that he does not use drugs.  No Known Allergies  Family History  Problem Relation Age of Onset  . Lupus Mother   . Aortic stenosis Brother   . Arrhythmia Brother         possible VT    Prior to Admission medications   Medication Sig Start Date End Date Taking? Authorizing Provider  dicyclomine (BENTYL) 20 MG tablet Take 20 mg by mouth 3 (three) times daily as needed for spasms.   Yes [provider]  HYDROcodone-acetaminophen (NORCO/VICODIN) 5-325 MG tablet Take 1 tablet by mouth every 6 (six) hours as needed for moderate pain.   Yes [provider]  omeprazole (PRILOSEC) 20 MG capsule Take 40 mg by mouth daily.   Yes [provider]  sucralfate (CARAFATE) 1 g tablet Take 1 g by mouth 4 (four) times daily.   Yes [provider]    Physical Exam:  Constitutional: Middle-aged male who appears to be in discomfort Vitals:   06/28/17 2024 06/28/17 2027 06/28/17 2243 06/29/17 0001  BP:  (!) 146/107 (!) 160/101 (!) 156/99  Pulse: 74 74 65 80  Resp: (!) 22 (!) 22 16 17   Temp:  97.9 F (36.6 C) 98.4 F (36.9 C)   TempSrc: Oral Oral Oral   SpO2: 100% 100% 99% 99%  Weight:  90.7 kg (200 lb)    Height:  5\' 10"  (1.778 m)     Eyes: PERRL, lids and conjunctivae normal ENMT: Mucous membranes are moist. Posterior pharynx clear of any exudate or lesions.Normal dentition.  Neck: normal, supple, no masses, no thyromegaly Respiratory: clear to auscultation bilaterally, no wheezing, no crackles. Normal respiratory effort. No accessory muscle use.  Cardiovascular: Regular rate and rhythm, no murmurs / rubs / gallops. No extremity edema. 2+ pedal pulses. No carotid bruits.  Abdomen: Left upper quadrant abdominal and flank tenderness.  Bowel sounds otherwise noted to be within normal limits. Musculoskeletal: no clubbing / cyanosis. No joint deformity upper and lower extremities. Good ROM, no contractures. Normal muscle tone.  Skin: no rashes, lesions, ulcers. No induration Neurologic: CN 2-12 grossly intact. Sensation intact, DTR normal. Strength 5/5 in all 4.  Psychiatric: Normal judgment and insight. Alert and oriented x 3. Normal  mood.     Labs on Admission: I have personally reviewed following labs and imaging studies  CBC: Recent Labs  Lab 06/28/17 2040  WBC 10.4  NEUTROABS 7.6  HGB 14.0  HCT 42.2  MCV 89.4  PLT 449   Basic Metabolic Panel: Recent Labs  Lab 06/28/17 2040  NA 138  K 3.9  CL 102  CO2 24  GLUCOSE 115*  BUN 14  CREATININE 1.43*  CALCIUM 9.8   GFR: Estimated Creatinine Clearance: 73.1 mL/min (A) (by C-G formula based on SCr of 1.43 mg/dL (H)). Liver Function Tests: Recent Labs  Lab 06/28/17 2040  AST 24  ALT 22  ALKPHOS 56  BILITOT 1.5*  PROT 7.2  ALBUMIN 4.5   Recent Labs  Lab 06/28/17 2040  LIPASE 25   No results for input(s): AMMONIA in the last 168 hours. Coagulation Profile: No results for input(s): INR, PROTIME in the last 168 hours. Cardiac Enzymes: No results for input(s): CKTOTAL, CKMB, CKMBINDEX, TROPONINI in the last 168 hours. BNP (last 3 results) No results for input(s): PROBNP in the last 8760 hours. HbA1C: No results for input(s): HGBA1C in the last 72 hours. CBG: No results for input(s): GLUCAP in the last  168 hours. Lipid Profile: No results for input(s): CHOL, HDL, LDLCALC, TRIG, CHOLHDL, LDLDIRECT in the last 72 hours. Thyroid Function Tests: No results for input(s): TSH, T4TOTAL, FREET4, T3FREE, THYROIDAB in the last 72 hours. Anemia Panel: No results for input(s): VITAMINB12, FOLATE, FERRITIN, TIBC, IRON, RETICCTPCT in the last 72 hours. Urine analysis: No results found for: COLORURINE, APPEARANCEUR, LABSPEC, PHURINE, GLUCOSEU, HGBUR, BILIRUBINUR, KETONESUR, PROTEINUR, UROBILINOGEN, NITRITE, LEUKOCYTESUR Sepsis Labs: No results found for this or any previous visit (from the past 240 hour(s)).   Radiological Exams on Admission: Ct Abdomen Pelvis W Contrast  Result Date: 06/28/2017 CLINICAL DATA:  Acute onset of left-sided abdominal pain, and left mid back pain. Nausea. EXAM: CT ABDOMEN AND PELVIS WITH CONTRAST TECHNIQUE: Multidetector CT  imaging of the abdomen and pelvis was performed using the standard protocol following bolus administration of intravenous contrast. CONTRAST:  19mL OMNIPAQUE IOHEXOL 300 MG/ML  SOLN COMPARISON:  None. FINDINGS: Lower chest: The visualized lung bases are grossly clear. The visualized portions of the mediastinum are unremarkable. Hepatobiliary: The liver is unremarkable in appearance. The gallbladder is unremarkable in appearance. The common bile duct remains normal in caliber. Pancreas: The pancreas is within normal limits. Spleen: The spleen is unremarkable in appearance. Adrenals/Urinary Tract: The adrenal glands are unremarkable in appearance. There appears to be acute infarct involving approximately half of the left renal parenchyma, with visible occlusion of 2 of the branches of the left renal artery on coronal images. This is concerning for an embolic event from an unknown source. The renal arteries are otherwise unremarkable in appearance. There is no definite evidence for fibromuscular dysplasia or renal artery dissection. There is no evidence of hydronephrosis. No renal or ureteral stones are identified. No perinephric stranding is seen. The right kidney is grossly unremarkable in appearance. Stomach/Bowel: The stomach is unremarkable in appearance. The small bowel is within normal limits. The appendix is normal in caliber, without evidence of appendicitis. The colon is unremarkable in appearance. Vascular/Lymphatic: The abdominal aorta is unremarkable in appearance. The inferior vena cava is grossly unremarkable. No retroperitoneal lymphadenopathy is seen. No pelvic sidewall lymphadenopathy is identified. Reproductive: The bladder is mildly distended and grossly unremarkable. The prostate is normal in size. Other: No additional soft tissue abnormalities are seen. Musculoskeletal: No acute osseous abnormalities are identified. The visualized musculature is unremarkable in appearance. IMPRESSION: 1. Acute  infarct involving approximately half of the left renal parenchyma, with visible occlusion of 2 of the branches of the left renal artery on coronal images. This is concerning for an embolic event from an unknown source. Would correlate for underlying coagulopathic condition. Echocardiography could be considered to exclude underlying thrombus, though the heart is grossly unremarkable in appearance on CT. 2. No definite evidence for fibromuscular dysplasia or renal artery dissection. The renal arteries are otherwise unremarkable in appearance. These results were called by telephone at the time of interpretation on 06/28/2017 at 10:35 pm to Dr. Melina Copa, who verbally acknowledged these results. Electronically Signed   By: Garald Balding M.D.   On: 06/28/2017 22:41    EKG: Independently reviewed.  Normal sinus rhythm at 65 bpm  Assessment/Plan Left renal infarct: Acute.  Patient presents with intermittent left upper quadrant abdominal and flank pain.  CT imaging reveals signs of acute left renal infarct.  Risk factors include patient history of travel. - Admit to a telemetry - Follow-up blood cultures - Follow-up urinalysis and urine drug screen - Follow-up PT/INR, APTT - Heparin per pharmacy - Check echocardiogram in a.m. -  May warrant hypercoagulable work-up versus autoimmune  Acute kidney injury: Patient noted to have recent primary care visit for blood work was obtained and noted to be within normal limits.  Creatinine on presentation is elevated up to 1.43.  Suspect symptoms may be related with acute thrombus. - Follow-up urinalysis - Continue to monitor  Lactic acidosis: Acute.  Initial lactic acid elevated at 2.25 on admission.  Patient with WBC at the upper limit of normal.  Question underlying infection - Trend lactic acid levels   Headaches: Acute.  Patient reports having severe headaches with visual changes that he makes seem are new.  With recent found thrombus there is concern for possible  strokes, and patient previously not noted to have elevated blood pressures.  Blood pressures could likely be related with pain. - Check CT scan of the brain   DVT prophylaxis: heparin  Code Status: Full Family Communication: Discussed plan of care with the patient family present at bedside Disposition Plan: Likely discharge home once medically stable Consults called: None Admission status: Observation  Norval Morton MD [facility] Pager [phone]   If 7PM-7AM, please contact night-coverage [email or web address] Password [email or web address]  06/29/2017, 12:07 AM

## 2017-06-29 NOTE — Progress Notes (Signed)
ANTICOAGULATION CONSULT NOTE - Initial Consult  Pharmacy Consult for Heparin Indication: renal infarction  No Known Allergies  Patient Measurements: Height: 5\' 10"  (177.8 cm) Weight: 200 lb (90.7 kg) IBW/kg (Calculated) : 73  Vital Signs: Temp: 98.4 F (36.9 C) (06/06 2243) Temp Source: Oral (06/06 2243) BP: 156/99 (06/07 0001) Pulse Rate: 80 (06/07 0001)  Labs: Recent Labs    06/28/17 2040  HGB 14.0  HCT 42.2  PLT 270  CREATININE 1.43*    Estimated Creatinine Clearance: 73.1 mL/min (A) (by C-G formula based on SCr of 1.43 mg/dL (H)).   Medical History: History reviewed. No pertinent past medical history.  Medications:  No current facility-administered medications on file prior to encounter.    Current Outpatient Medications on File Prior to Encounter  Medication Sig Dispense Refill  . dicyclomine (BENTYL) 20 MG tablet Take 20 mg by mouth 3 (three) times daily as needed for spasms.    Marland Kitchen HYDROcodone-acetaminophen (NORCO/VICODIN) 5-325 MG tablet Take 1 tablet by mouth every 6 (six) hours as needed for moderate pain.    Marland Kitchen omeprazole (PRILOSEC) 20 MG capsule Take 40 mg by mouth daily.    . sucralfate (CARAFATE) 1 g tablet Take 1 g by mouth 4 (four) times daily.       Assessment: 47 y.o. 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   Caryl Pina 06/29/2017,12:17 AM

## 2017-06-29 NOTE — ED Notes (Signed)
Pt in room and alert, no family @ bedside currently VS documented Call light within reach

## 2017-06-29 NOTE — ED Notes (Signed)
Patient called out asking for pain medication and nausea medication, pt inquired if meds could be mixed together and given

## 2017-06-30 ENCOUNTER — Encounter (HOSPITAL_COMMUNITY): Payer: Self-pay | Admitting: *Deleted

## 2017-06-30 ENCOUNTER — Observation Stay (HOSPITAL_BASED_OUTPATIENT_CLINIC_OR_DEPARTMENT_OTHER): Payer: 59

## 2017-06-30 DIAGNOSIS — I10 Essential (primary) hypertension: Secondary | ICD-10-CM | POA: Diagnosis not present

## 2017-06-30 DIAGNOSIS — N179 Acute kidney failure, unspecified: Secondary | ICD-10-CM | POA: Diagnosis not present

## 2017-06-30 DIAGNOSIS — N280 Ischemia and infarction of kidney: Secondary | ICD-10-CM

## 2017-06-30 DIAGNOSIS — E872 Acidosis: Secondary | ICD-10-CM

## 2017-06-30 LAB — BASIC METABOLIC PANEL
ANION GAP: 9 (ref 5–15)
BUN: 8 mg/dL (ref 6–20)
CHLORIDE: 105 mmol/L (ref 101–111)
CO2: 22 mmol/L (ref 22–32)
Calcium: 8.8 mg/dL — ABNORMAL LOW (ref 8.9–10.3)
Creatinine, Ser: 1.25 mg/dL — ABNORMAL HIGH (ref 0.61–1.24)
GFR calc Af Amer: >60 mL/min (ref >60)
GFR calc non Af Amer: >60 mL/min (ref >60)
GLUCOSE: 116 mg/dL — AB (ref 65–99)
POTASSIUM: 3.5 mmol/L (ref 3.5–5.1)
Sodium: 136 mmol/L (ref 135–145)

## 2017-06-30 LAB — CBC
HCT: 37.6 % — ABNORMAL LOW (ref 39.0–52.0)
Hemoglobin: 12.4 g/dL — ABNORMAL LOW (ref 13.0–17.0)
MCH: 29.5 pg (ref 26.0–34.0)
MCHC: 33 g/dL (ref 30.0–36.0)
MCV: 89.3 fL (ref 78.0–100.0)
Platelets: 203 10*3/uL (ref 150–400)
RBC: 4.21 MIL/uL — AB (ref 4.22–5.81)
RDW: 12.3 % (ref 11.5–15.5)
WBC: 14.2 10*3/uL — AB (ref 4.0–10.5)

## 2017-06-30 LAB — HEPARIN LEVEL (UNFRACTIONATED): Heparin Unfractionated: 0.74 IU/mL — ABNORMAL HIGH (ref 0.30–0.70)

## 2017-06-30 LAB — PROTEIN / CREATININE RATIO, URINE
CREATININE, URINE: 70.1 mg/dL
Protein Creatinine Ratio: 0.17 mg/mg{Cre} — ABNORMAL HIGH (ref 0.00–0.15)
Total Protein, Urine: 12 mg/dL

## 2017-06-30 MED ORDER — DOCUSATE SODIUM 100 MG PO CAPS: 100.0000 mg | ORAL_CAPSULE | Freq: Two times a day (BID) | ORAL | 0 refills | Status: DC

## 2017-06-30 MED ORDER — RIVAROXABAN (XARELTO) VTE STARTER PACK (15 & 20 MG): ORAL_TABLET | ORAL | 0 refills | Status: DC

## 2017-06-30 MED ORDER — RIVAROXABAN 15 MG PO TABS
15.0000 mg | ORAL_TABLET | Freq: Two times a day (BID) | ORAL | Status: DC
  Administered 2017-06-30: 15 mg via ORAL
  Filled 2017-06-30: qty 1

## 2017-06-30 MED ORDER — OXYCODONE-ACETAMINOPHEN 5-325 MG PO TABS: 1.0000 | ORAL_TABLET | ORAL | 0 refills | Status: DC | PRN

## 2017-06-30 NOTE — Care Management Note (Addendum)
Case Management Note  Patient Details  Name: Glen Rollins MRN: [id number] Date of Birth: 1970-10-14  Subjective/Objective:   Pt admitted with kidney infarct                 Action/Plan:  PTA independent from home.  Pt has PCP and denied barriers with obtaining medications.  Pt will discharge home on Xarelto - CM verified with pt that he does not have Medicare nor Medicaid.  CM provided both free 30 and reduced copay card to pay.  CM was unable to perform benefit check due to discharge on weekend.  CM contacted pharmacy of choice [facility] in [place] can fill script today.  No other CM needs determined at the time this note written.     Expected Discharge Date:  06/30/17               Expected Discharge Plan:  Home/Self Care  In-House Referral:     Discharge planning Services  CM Consult  Post Acute Care Choice:    Choice offered to:     DME Arranged:    DME Agency:     HH Arranged:    HH Agency:     Status of Service:  Completed, signed off  If discussed at [employer] of Stay Meetings, dates discussed:    Additional Comments:  Maryclare Labrador, RN 06/30/2017, 2:08 PM

## 2017-06-30 NOTE — Discharge Summary (Signed)
Physician Discharge Summary  Elyan Vanwieren TOI:[id number] DOB: 1970-03-27 DOA: 06/28/2017  PCP: [name], MD  Admit date: 06/28/2017 Discharge date: 06/30/2017  Time spent: 42 minutes  Recommendations for Outpatient Follow-up:  1. Follow-up with PCP in 1 week   Discharge Diagnoses:  Principal Problem:   Renal infarct [facility]) Active Problems:   Lactic acidosis   AKI (acute kidney injury) ([place])   New onset of headaches   Discharge Condition: Stable and improved  Diet recommendation: Cardiac diet  Filed Weights   06/28/17 2027 06/29/17 2043  Weight: 90.7 kg (200 lb) 91.3 kg (201 lb 3.2 oz)     Hospital Course:  47 year old healthy male presents emergency department with severe abdominal pain found to have a left renal infarct causing some mild kidney injury with a creatinine bump up to 1.5.  Patient was admitted and started heparin drip and pain control.  Nephrology was consulted to have done a work-up some of which is pending.  His HIV was negative.  His antiphospholipid evaluation is pending his ANA IFA reflex is pending his sed rate was normal.  SH was normal.  Drug screen was positive for opiates which was given to him in the emergency department.  He had a cardiac echo also which was normal including no evidence for PFO.  Etiology of his renal infarct is unclear and undetermined at this point.  Patient be discharged home on Xarelto starter kit and also with a course of Percocet to last him for the next week or so.  Patient is to follow-up with his primary care physician in a week or so.  His pain was markedly better in the last 24 hours requiring minimal oral pain medications.  Patient's been afebrile vital signs been stable.  His wife is a nurse his case has been thoroughly discussed with his wife.  Dr. Posey Pronto our nephrologist is also discussed with his wife and him the etiology of his renal infarct which is unclear.  All patients have been answered.  Patient be discharged home  in stable and improved condition.  Consultations:  Nephrology  Discharge Exam: Vitals:   06/30/17 0646 06/30/17 1002  BP: (!) 147/86 (!) 152/96  Pulse: 81 81  Resp: 20 18  Temp: 98.2 F (36.8 C) 99.1 F (37.3 C)  SpO2: 93% 96%    General: Alert and oriented x4 no apparent distress cooperative fairly Cardiovascular: Regular rate and rhythm without murmurs rubs or gallops Respiratory: Clear to auscultation bilaterally no wheezes rhonchi rales  Discharge Instructions   Discharge Instructions    Diet - low sodium heart healthy   Complete by:  As directed    Increase activity slowly   Complete by:  As directed      Allergies as of 06/30/2017   No Known Allergies     Medication List    STOP taking these medications   HYDROcodone-acetaminophen 5-325 MG tablet Commonly known as:  NORCO/VICODIN     TAKE these medications   dicyclomine 20 MG tablet Commonly known as:  BENTYL Take 20 mg by mouth 3 (three) times daily as needed for spasms.   docusate sodium 100 MG capsule Commonly known as:  COLACE Take 1 capsule (100 mg total) by mouth 2 (two) times daily.   omeprazole 20 MG capsule Commonly known as:  PRILOSEC Take 40 mg by mouth daily.   oxyCODONE-acetaminophen 5-325 MG tablet Commonly known as:  PERCOCET/ROXICET Take 1-2 tablets by mouth every 4 (four) hours as needed for moderate pain.  Rivaroxaban 15 & 20 MG Tbpk Take as directed on package: Start with one 68m tablet by mouth twice a day with food. On Day 22, switch to one 293mtablet once a day with food.   sucralfate 1 g tablet Commonly known as:  CARAFATE Take 1 g by mouth 4 (four) times daily.      No Known Allergies Follow-up Information    MoLondon PepperMD Follow up in 1 week(s).   Specialty:  Family Medicine Contact information: [phone]          The results of significant diagnostics from this hospitalization (including  imaging, microbiology, ancillary and laboratory) are listed below for reference.    Significant Diagnostic Studies: Ct Head Wo Contrast  Result Date: 06/29/2017 CLINICAL DATA:  Thunderclap headache EXAM: CT HEAD WITHOUT CONTRAST TECHNIQUE: Contiguous axial images were obtained from the base of the skull through the vertex without intravenous contrast. COMPARISON:  None. FINDINGS: Brain: There is no mass, hemorrhage or extra-axial collection. The size and configuration of the ventricles and extra-axial CSF spaces are normal. There is no acute or chronic infarction. The brain parenchyma is normal. Vascular: No abnormal hyperdensity of the major intracranial arteries or dural venous sinuses. No intracranial atherosclerosis. Skull: The visualized skull base, calvarium and extracranial soft tissues are normal. Sinuses/Orbits: No fluid levels or advanced mucosal thickening of the visualized paranasal sinuses. No mastoid or middle ear effusion. The orbits are normal. IMPRESSION: Normal head CT. Electronically Signed   By: KeUlyses Jarred.D.   On: 06/29/2017 03:13   Ct Abdomen Pelvis W Contrast  Result Date: 06/28/2017 CLINICAL DATA:  Acute onset of left-sided abdominal pain, and left mid back pain. Nausea. EXAM: CT ABDOMEN AND PELVIS WITH CONTRAST TECHNIQUE: Multidetector CT imaging of the abdomen and pelvis was performed using the standard protocol following bolus administration of intravenous contrast. CONTRAST:  10033mMNIPAQUE IOHEXOL 300 MG/ML  SOLN COMPARISON:  None. FINDINGS: Lower chest: The visualized lung bases are grossly clear. The visualized portions of the mediastinum are unremarkable. Hepatobiliary: The liver is unremarkable in appearance. The gallbladder is unremarkable in appearance. The common bile duct remains normal in caliber. Pancreas: The pancreas is within normal limits. Spleen: The spleen is unremarkable in appearance. Adrenals/Urinary Tract: The adrenal glands are unremarkable in  appearance. There appears to be acute infarct involving approximately half of the left renal parenchyma, with visible occlusion of 2 of the branches of the left renal artery on coronal images. This is concerning for an embolic event from an unknown source. The renal arteries are otherwise unremarkable in appearance. There is no definite evidence for fibromuscular dysplasia or renal artery dissection. There is no evidence of hydronephrosis. No renal or ureteral stones are identified. No perinephric stranding is seen. The right kidney is grossly unremarkable in appearance. Stomach/Bowel: The stomach is unremarkable in appearance. The small bowel is within normal limits. The appendix is normal in caliber, without evidence of appendicitis. The colon is unremarkable in appearance. Vascular/Lymphatic: The abdominal aorta is unremarkable in appearance. The inferior vena cava is grossly unremarkable. No retroperitoneal lymphadenopathy is seen. No pelvic sidewall lymphadenopathy is identified. Reproductive: The bladder is mildly distended and grossly unremarkable. The prostate is normal in size. Other: No additional soft tissue abnormalities are seen. Musculoskeletal: No acute osseous abnormalities are identified. The visualized musculature is unremarkable in appearance. IMPRESSION: 1. Acute infarct involving approximately half of the left renal parenchyma, with visible occlusion of  2 of the branches of the left renal artery on coronal images. This is concerning for an embolic event from an unknown source. Would correlate for underlying coagulopathic condition. Echocardiography could be considered to exclude underlying thrombus, though the heart is grossly unremarkable in appearance on CT. 2. No definite evidence for fibromuscular dysplasia or renal artery dissection. The renal arteries are otherwise unremarkable in appearance. These results were called by telephone at the time of interpretation on 06/28/2017 at 10:35 pm to  Dr. Melina Copa, who verbally acknowledged these results. Electronically Signed   By: Garald Balding M.D.   On: 06/28/2017 22:41    Microbiology: No results found for this or any previous visit (from the past 240 hour(s)).   Labs: Basic Metabolic Panel: Recent Labs  Lab 06/28/17 2040 06/29/17 0800 06/30/17 0519  NA 138 136 136  K 3.9 4.0 3.5  CL 102 103 105  CO2 _0 GLUCOSE 115* 99 116*  BUN _1 CREATININE 1.43* 1.20 1.25*  CALCIUM 9.8 8.5* 8.8*   Liver Function Tests: Recent Labs  Lab 06/28/17 2040  AST 24  ALT 22  ALKPHOS 56  BILITOT 1.5*  PROT 7.2  ALBUMIN 4.5   Recent Labs  Lab 06/28/17 2040  LIPASE 25   No results for input(s): AMMONIA in the last 168 hours. CBC: Recent Labs  Lab 06/28/17 2040 06/29/17 0800 06/30/17 0519  WBC 10.4 9.6 14.2*  NEUTROABS 7.6  --   --   HGB 14.0 12.5* 12.4*  HCT 42.2 37.9* 37.6*  MCV 89.4 89.4 89.3  PLT 270 214 203   Cardiac Enzymes: No results for input(s): CKTOTAL, CKMB, CKMBINDEX, TROPONINI in the last 168 hours. BNP: BNP (last 3 results) No results for input(s): BNP in the last 8760 hours.  ProBNP (last 3 results) No results for input(s): PROBNP in the last 8760 hours.  CBG: No results for input(s): GLUCAP in the last 168 hours.     Signed:  Phillips Grout MD.  [facility] 06/30/2017, 1:47 PM

## 2017-06-30 NOTE — Progress Notes (Addendum)
Patient ID: Glen Rollins, male   DOB: Jun 05, 1970, 47 y.o.   MRN: [id number] [facility] Progress Note   Assessment/ Plan:   1.  Acute kidney injury:  He appears to have suffered kidney injury as a result of losing significant renal mass as evidenced by CT angiogram/renal infarction.  His current creatinine of 1.2 indeed might be his newly established baseline (GFR 65 mL/minute). 2.  Acute renal infarction: Unclear etiology, ongoing evaluation for embolic source and hypercoagulable states. Started on anticoagulation with warfarin drip and being transitioned over to Eliquis.  Await results of echocardiogram done earlier today to assess for right-to-left shunt. 3.  Hypertension: Appears to be situational secondary to pain, with ongoing pain management.  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   Objective:   BP (!) 152/96 (BP Location: Right Arm)   Pulse 81   Temp 99.1 F (37.3 C) (Oral)   Resp 18   Ht 5\' 10"  (1.778 m)   Wt 91.3 kg (201 lb 3.2 oz)   SpO2 96%   BMI 28.87 kg/m   Intake/Output Summary (Last 24 hours) at 06/30/2017 1009 Last data filed at 06/30/2017 3716 Gross per 24 hour  Intake 6718.41 ml  Output 2100 ml  Net 4618.41 ml   Weight change: 0.544 kg (1 lb 3.2 oz)  Physical Exam: Gen: Appears to be comfortable resting in bed, undergoing echocardiogram with bubble study CVS: Pulse regular rhythm, normal rate, S1 and S2 normal Resp: Clear to auscultation, no rales/rhonchi Abd: Left upper quadrant tenderness, no rebound, bowel sounds normal Ext: Without lower extremity edema  Imaging: Ct Head Wo Contrast  Result Date: 06/29/2017 CLINICAL DATA:  Thunderclap headache EXAM: CT HEAD WITHOUT CONTRAST TECHNIQUE: Contiguous axial images were obtained from the base of the skull  through the vertex without intravenous contrast. COMPARISON:  None. FINDINGS: Brain: There is no mass, hemorrhage or extra-axial collection. The size and configuration of the ventricles and extra-axial CSF spaces are normal. There is no acute or chronic infarction. The brain parenchyma is normal. Vascular: No abnormal hyperdensity of the major intracranial arteries or dural venous sinuses. No intracranial atherosclerosis. Skull: The visualized skull base, calvarium and extracranial soft tissues are normal. Sinuses/Orbits: No fluid levels or advanced mucosal thickening of the visualized paranasal sinuses. No mastoid or middle ear effusion. The orbits are normal. IMPRESSION: Normal head CT. Electronically Signed   By: Ulyses Jarred M.D.   On: 06/29/2017 03:13   Ct Abdomen Pelvis W Contrast  Result Date: 06/28/2017 CLINICAL DATA:  Acute onset of left-sided abdominal pain, and left mid back pain. Nausea. EXAM: CT ABDOMEN AND PELVIS WITH CONTRAST TECHNIQUE: Multidetector CT imaging of the abdomen and pelvis was performed using the standard protocol following bolus administration of intravenous contrast. CONTRAST:  159mL OMNIPAQUE IOHEXOL 300 MG/ML  SOLN COMPARISON:  None. FINDINGS: Lower chest: The visualized lung bases are grossly clear. The visualized portions of the mediastinum are unremarkable. Hepatobiliary: The liver is unremarkable in appearance. The gallbladder is unremarkable in appearance. The common bile duct remains normal in caliber. Pancreas: The pancreas is within normal limits. Spleen: The spleen is unremarkable in appearance. Adrenals/Urinary Tract: The adrenal glands are unremarkable in appearance. There appears to be acute infarct involving approximately half of the left renal parenchyma, with visible occlusion of 2 of the branches of the left renal artery  on coronal images. This is concerning for an embolic event from an unknown source. The renal arteries are otherwise unremarkable in appearance.  There is no definite evidence for fibromuscular dysplasia or renal artery dissection. There is no evidence of hydronephrosis. No renal or ureteral stones are identified. No perinephric stranding is seen. The right kidney is grossly unremarkable in appearance. Stomach/Bowel: The stomach is unremarkable in appearance. The small bowel is within normal limits. The appendix is normal in caliber, without evidence of appendicitis. The colon is unremarkable in appearance. Vascular/Lymphatic: The abdominal aorta is unremarkable in appearance. The inferior vena cava is grossly unremarkable. No retroperitoneal lymphadenopathy is seen. No pelvic sidewall lymphadenopathy is identified. Reproductive: The bladder is mildly distended and grossly unremarkable. The prostate is normal in size. Other: No additional soft tissue abnormalities are seen. Musculoskeletal: No acute osseous abnormalities are identified. The visualized musculature is unremarkable in appearance. IMPRESSION: 1. Acute infarct involving approximately half of the left renal parenchyma, with visible occlusion of 2 of the branches of the left renal artery on coronal images. This is concerning for an embolic event from an unknown source. Would correlate for underlying coagulopathic condition. Echocardiography could be considered to exclude underlying thrombus, though the heart is grossly unremarkable in appearance on CT. 2. No definite evidence for fibromuscular dysplasia or renal artery dissection. The renal arteries are otherwise unremarkable in appearance. These results were called by telephone at the time of interpretation on 06/28/2017 at 10:35 pm to Dr. Melina Copa, who verbally acknowledged these results. Electronically Signed   By: Garald Balding M.D.   On: 06/28/2017 22:41    Labs: BMET Recent Labs  Lab 06/28/17 2040 06/29/17 0800 06/30/17 0519  NA 138 136 136  K 3.9 4.0 3.5  CL 102 103 105  CO2 24 24 22   GLUCOSE 115* 99 116*  BUN 14 12 8   CREATININE  1.43* 1.20 1.25*  CALCIUM 9.8 8.5* 8.8*   CBC Recent Labs  Lab 06/28/17 2040 06/29/17 0800 06/30/17 0519  WBC 10.4 9.6 14.2*  NEUTROABS 7.6  --   --   HGB 14.0 12.5* 12.4*  HCT 42.2 37.9* 37.6*  MCV 89.4 89.4 89.3  PLT 270 214 203    Medications:    . docusate sodium  100 mg Oral BID  . pantoprazole  40 mg Oral Daily  . rivaroxaban  15 mg Oral BID   Elmarie Shiley, MD 06/30/2017, 10:09 AM

## 2017-06-30 NOTE — Progress Notes (Signed)
  Echocardiogram 2D Echocardiogram has been performed.  Glen Rollins F 06/30/2017, 10:48 AM

## 2017-06-30 NOTE — Progress Notes (Signed)
Pt complain of pain not being controlled last night Dr Silas [place] notified see physicians order,pt received one time dose of IV dilaudid at 2115,pt had no more complaints or pain med last night,pt stated he feels great this morning will continue to monitor

## 2017-07-02 LAB — ANTINUCLEAR ANTIBODIES, IFA: ANA Ab, IFA: NEGATIVE

## 2017-07-03 LAB — ANTIPHOSPHOLIPID SYNDROME EVAL, BLD
Anticardiolipin IgA: <9 APL U/mL (ref 0–11)
Anticardiolipin IgG: <9 GPL U/mL (ref 0–14)
DRVVT: 31.9 s (ref 0.0–47.0)
PHOSPHATYDALSERINE, IGG: 2 {GPS'U} (ref 0–11)
PHOSPHATYDALSERINE, IGM: 10 {MPS'U} (ref 0–25)
PTT Lupus Anticoagulant: 40.7 s (ref 0.0–51.9)
Phosphatydalserine, IgA: 2 APS IgA (ref 0–20)

## 2017-07-04 LAB — CULTURE, BLOOD (ROUTINE X 2)
CULTURE: NO GROWTH
CULTURE: NO GROWTH
SPECIAL REQUESTS: ADEQUATE

## 2017-07-05 DIAGNOSIS — N280 Ischemia and infarction of kidney: Secondary | ICD-10-CM | POA: Diagnosis not present

## 2017-07-05 DIAGNOSIS — Z09 Encounter for follow-up examination after completed treatment for conditions other than malignant neoplasm: Secondary | ICD-10-CM | POA: Diagnosis not present

## 2017-07-05 DIAGNOSIS — I10 Essential (primary) hypertension: Secondary | ICD-10-CM | POA: Diagnosis not present

## 2017-07-19 ENCOUNTER — Encounter: Payer: Self-pay | Admitting: Hematology

## 2017-07-19 ENCOUNTER — Telehealth: Payer: Self-pay | Admitting: Hematology

## 2017-07-19 NOTE — Telephone Encounter (Signed)
New referral received from Dr. Orland Mustard at [facility] for dx of acute embolic renal infarction. Pt has been scheduled to see Dr. Irene Limbo on 7/16 at 1pm. Pt aware to arrive 30 minutes early. Letter mailed.

## 2017-08-02 DIAGNOSIS — N280 Ischemia and infarction of kidney: Secondary | ICD-10-CM | POA: Diagnosis not present

## 2017-08-02 DIAGNOSIS — I10 Essential (primary) hypertension: Secondary | ICD-10-CM | POA: Diagnosis not present

## 2017-08-06 NOTE — Progress Notes (Signed)
HEMATOLOGY/ONCOLOGY CONSULTATION NOTE  Date of Service: 08/07/2017  Patient Care Team: [name], MD as PCP - General (Family Medicine)  CHIEF COMPLAINTS/PURPOSE OF CONSULTATION:  Acute embolic renal infarction   HISTORY OF PRESENTING ILLNESS:   Glen Rollins is a wonderful 47 y.o. male who has been referred to [place] by Dr. [name] for evaluation and management of Acute embolic renal infarction. The pt reports that he is doing well overall.   The pt presented to the ED on 06/28/17 with severe abdominal pain and was discovered to have a left renal infarct with mild kidney injury. The pt was admitted with heparin and discharged with a Xarelto starter kit. He notes that his upper abdominal pain radiated to his back. The pt is continuing to take Xarelto and is newly taking Xarelto as well, without history of high blood pressure.   The pt notes that prior to the event, for a few weeks he felt more significant fatigue. He denies any unexpected weight loss, fevers, chills, and night sweats. He does note some recent, sporadic episodes of sweating and then being cold.   The pt notes that he did not have heart palpitations, chest pain or SOB in the setting of his clot, nor historically. He also denies a history of dry mouth, mouth sores, red/swollen/painful joints, and red eyes.   The pt notes that in the weeks leading up to his clot, he had leg cramps at night in his lower legs and denies this occurring while walking. He also had been having bad headaches, including the worst headache he had ever had 4-5 days before his ED trip. He denies neck pain and remembers the top of his head hurting, denies changes in speech or dizziness. He felt like he possibly had some blurry vision at the time. After lying down for a few hours his headache subsided and he resumed his normal activities.   He notes that he has seen a dermatologist regarding a recurring chest rash, tinea versicolor, for which he  takes Itroconazole.   Of note prior to the patient's visit today, pt has had CT A/P completed on 06/28/17 with results revealing Acute infarct involving approximately half of the left renal parenchyma, with visible occlusion of 2 of the branches of the left renal artery on coronal images. This is concerning for an embolic event from an unknown source. Would correlate for underlying coagulopathic condition. Echocardiography could be considered to exclude underlying thrombus, though the heart is grossly unremarkable in appearance on CT. 2. No definite evidence for fibromuscular dysplasia or renal artery dissection. The renal arteries are otherwise unremarkable in appearance.   Most recent lab results (07/05/17) of CBC w/diff is as follows: all values are WNL except for PLT at 434k, Mono abs at 1.4k.  On review of systems, pt reports resolving fatigue, and denies chest pain, palpitations, mouth sores, dry mouth, red/swollen/painful joints, SOB, fevers, chills, night sweats, unexpected weight loss, noticing any new lumps or bumps, abdominal pains, leg swelling, and any other symptoms.   On PMHx the pt reports tinea versicolor, left wrist fx as a child, left shoulder surgery in 2001, denies abdominal trauma, back trauma, and high blood pressure. On Social Hx the pt reports social ETOH consumption, denies ever smoking cigarettes or drug use. 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  On Family Hx the pt reports father with high blood pressure, mother with lupus, mom had mini-strokes, mom died from lung cancer and was  a life-long smoker, and denies any blood disorders. Brother at 66 y/o with Afib.    MEDICAL HISTORY:  No past medical history on file.  SURGICAL HISTORY: Past Surgical History:  Procedure Laterality Date  . SHOULDER SURGERY      SOCIAL HISTORY: Social History   Socioeconomic History  . Marital status: Married    Spouse name: Not on file  . Number of  children: Not on file  . Years of education: Not on file  . Highest education level: Not on file  Occupational History  . Not on file  Social Needs  . Financial resource strain: Not on file  . Food insecurity:    Worry: Not on file    Inability: Not on file  . Transportation needs:    Medical: Not on file    Non-medical: Not on file  Tobacco Use  . Smoking status: Never Smoker  . Smokeless tobacco: Never Used  Substance and Sexual Activity  . Alcohol use: Yes  . Drug use: Never  . Sexual activity: Not on file  Lifestyle  . Physical activity:    Days per week: Not on file    Minutes per session: Not on file  . Stress: Not on file  Relationships  . Social connections:    Talks on phone: Not on file    Gets together: Not on file    Attends religious service: Not on file    Active member of club or organization: Not on file    Attends meetings of clubs or organizations: Not on file    Relationship status: Not on file  . Intimate partner violence:    Fear of current or ex partner: Not on file    Emotionally abused: Not on file    Physically abused: Not on file    Forced sexual activity: Not on file  Other Topics Concern  . Not on file  Social History Narrative  . Not on file    FAMILY HISTORY: Family History  Problem Relation Age of Onset  . Lupus Mother   . Aortic stenosis Brother   . Arrhythmia Brother        possible VT    ALLERGIES:  has No Known Allergies.  MEDICATIONS:  Current Outpatient Medications  Medication Sig Dispense Refill  . dicyclomine (BENTYL) 20 MG tablet Take 20 mg by mouth 3 (three) times daily as needed for spasms.    Marland Kitchen docusate sodium (COLACE) 100 MG capsule Take 1 capsule (100 mg total) by mouth 2 (two) times daily. 10 capsule 0  . omeprazole (PRILOSEC) 20 MG capsule Take 40 mg by mouth daily.    Marland Kitchen oxyCODONE-acetaminophen (PERCOCET/ROXICET) 5-325 MG tablet Take 1-2 tablets by mouth every 4 (four) hours as needed for moderate pain. 30  tablet 0  . Rivaroxaban 15 & 20 MG TBPK Take as directed on package: Start with one 61m tablet by mouth twice a day with food. On Day 22, switch to one 261mtablet once a day with food. 51 each 0  . sucralfate (CARAFATE) 1 g tablet Take 1 g by mouth 4 (four) times daily.     No current facility-administered medications for this visit.     REVIEW OF SYSTEMS:    10 Point review of Systems was done is negative except as noted above.  PHYSICAL EXAMINATION:  . Vitals:   08/07/17 1300  BP: 114/66  Pulse: 63  Resp: 18  Temp: 98.4 F (36.9 C)  SpO2: 100%  Filed Weights   08/07/17 1300  Weight: 194 lb 9.6 oz (88.3 kg)   .Body mass index is 27.92 kg/m.  GENERAL:alert, in no acute distress and comfortable SKIN: no acute rashes, no significant lesions EYES: conjunctiva are pink and non-injected, sclera anicteric OROPHARYNX: MMM, no exudates, no oropharyngeal erythema or ulceration NECK: supple, no JVD LYMPH:  no palpable lymphadenopathy in the cervical, axillary or inguinal regions LUNGS: clear to auscultation b/l with normal respiratory effort HEART: regular rate & rhythm ABDOMEN:  normoactive bowel sounds , non tender, not distended. Extremity: no pedal edema PSYCH: alert & oriented x 3 with fluent speech NEURO: no focal motor/sensory deficits  LABORATORY DATA:  I have reviewed the data as listed  . CBC Latest Ref Rng & Units 06/30/2017 06/29/2017 06/28/2017  WBC 4.0 - 10.5 K/uL 14.2(H) 9.6 10.4  Hemoglobin 13.0 - 17.0 g/dL 12.4(L) 12.5(L) 14.0  Hematocrit 39.0 - 52.0 % 37.6(L) 37.9(L) 42.2  Platelets 150 - 400 K/uL 203 214 270    . CMP Latest Ref Rng & Units 06/30/2017 06/29/2017 06/28/2017  Glucose 65 - 99 mg/dL 116(H) 99 115(H)  BUN 6 - 20 mg/dL _0 Creatinine 0.61 - 1.24 mg/dL 1.25(H) 1.20 1.43(H)  Sodium 135 - 145 mmol/L 136 136 138  Potassium 3.5 - 5.1 mmol/L 3.5 4.0 3.9  Chloride 101 - 111 mmol/L 105 103 102  CO2 22 - 32 mmol/L _1 Calcium 8.9 - 10.3 mg/dL  8.8(L) 8.5(L) 9.8  Total Protein 6.5 - 8.1 g/dL - - 7.2  Total Bilirubin 0.3 - 1.2 mg/dL - - 1.5(H)  Alkaline Phos 38 - 126 U/L - - 56  AST 15 - 41 U/L - - 24  ALT 17 - 63 U/L - - 22   07/05/17 CBC w/diff:    Component     Latest Ref Rng & Units 08/07/2017  IgG (Immunoglobin G), Serum     700 - 1,600 mg/dL 962  IgA     90 - 386 mg/dL 134  IgM (Immunoglobulin M), Srm     20 - 172 mg/dL 121  Total Protein ELP     6.0 - 8.5 g/dL 7.0  Albumin SerPl Elph-Mcnc     2.9 - 4.4 g/dL 3.9  Alpha 1     0.0 - 0.4 g/dL 0.3  Alpha2 Glob SerPl Elph-Mcnc     0.4 - 1.0 g/dL 0.8  B-Globulin SerPl Elph-Mcnc     0.7 - 1.3 g/dL 0.9  Gamma Glob SerPl Elph-Mcnc     0.4 - 1.8 g/dL 1.1  M Protein SerPl Elph-Mcnc     Not Observed g/dL Not Observed  Globulin, Total     2.2 - 3.9 g/dL 3.1  Albumin/Glob SerPl     0.7 - 1.7 1.3  IFE 1      Comment  Please Note (HCV):      Comment  Cytoplasmic (C-ANCA)     Neg:<1:20 titer <1:20  P-ANCA     Neg:<1:20 titer <1:20  Atypical P-ANCA titer     Neg:<1:20 titer <1:20  Beta-2 Glycoprotein I Ab, IgG     0 - 20 GPI IgG units <9  Beta-2-Glycoprotein I IgM     0 - 32 GPI IgM units <9  Beta-2-Glycoprotein I IgA     0 - 25 GPI IgA units <9  Sed Rate     0 - 16 mm/hr 10  ANA Ab, IFA      Negative  Antithrombin Activity  75 - 120 % 101  Homocysteine     0.0 - 15.0 umol/L 11.7   Factor 5 leiden  Order: [id number]  Status:  Final result Visible to patient:  No (Not Released) Next appt:  12/04/2017 at 10:00 AM in Oncology [facility] 4) Dx:  Renal infarction ([place])  Component 7d ago  Recommendations-F5LEID: Comment   Comment: (NOTE)  Result: Negative (no mutation found)        Prothrombin gene mutation  Order: [id number]  Status:  Final result Visible to patient:  No (Not Released) Next appt:  12/04/2017 at 10:00 AM in Oncology [facility] 4) Dx:  Renal infarction ([place])  Component 7d ago  Recommendations-PTGENE: Comment     Comment: (NOTE)  NEGATIVE  No mutation identified.          RADIOGRAPHIC STUDIES: I have personally reviewed the radiological images as listed and agreed with the findings in the report. No results found.  ASSESSMENT & PLAN:   47 y.o. male with  1. Acute embolic renal infarction Plan -Discussed patient's most recent labs from 07/05/17, PLT at 434k, HGB normal at 13.8, WBC normal at 8.5k. No polycythemia. -Antiphospholipid syndrome eval 06/29/17 was normal -Discussed the 06/28/17 CT A/P with the pt Acute infarct involving approximately half of the left renal parenchyma, with visible occlusion of 2 of the branches of the left renal artery on coronal images. This is concerning for an embolic event from an unknown source.  -06/30/17 ECHO did not reveal defects or patent foramen ovale and no right-to-left atrial level shunt.  -Could have cardio-embolic source if Paroxysmal Afib is present- recommend cardiology input -Continue 22m Xarelto once a day for at least 6 months -Will order antibody testing for beta2 glycoprotein today -Vasculitis panel and Homocysteine levels today - reviewed ANCA neg, homocysteine WNL -FVL and PGM neg -JAk2 mutation pending. -Will see the pt back in 4 months    Labs today RTC with Dr KIrene Limboin 4 months with labs   All of the patients questions were answered with apparent satisfaction. The patient knows to call the clinic with any problems, questions or concerns.  The total time spent in the appt was 50 minutes and more than 50% was on counseling and direct patient cares.    GSullivan LoneMD MS AAHIVMS SBaylor [facility]CU[facility] At WcaHematology/Oncology Physician [facility] (Office):       [phone](Work cell):  [phone](Fax):           [phone] 08/07/2017 2:01 PM  I, SBaldwin Jamaica am acting as a sEducation administratorfor Dr KIrene Limbo   .I have reviewed the above documentation for accuracy and completeness, and I agree with the above. .Brunetta GeneraMD

## 2017-08-07 ENCOUNTER — Inpatient Hospital Stay: Payer: 59 | Attending: Hematology | Admitting: Hematology

## 2017-08-07 ENCOUNTER — Inpatient Hospital Stay: Payer: 59

## 2017-08-07 VITALS — BP 114/66 | HR 63 | Temp 98.4°F | Resp 18 | Ht 70.0 in | Wt 194.6 lb

## 2017-08-07 DIAGNOSIS — N280 Ischemia and infarction of kidney: Secondary | ICD-10-CM

## 2017-08-07 DIAGNOSIS — D6859 Other primary thrombophilia: Secondary | ICD-10-CM

## 2017-08-07 LAB — SEDIMENTATION RATE: SED RATE: 10 mm/h (ref 0–16)

## 2017-08-07 LAB — ANTITHROMBIN III: AntiThromb III Func: 101 % (ref 75–120)

## 2017-08-08 LAB — ANCA TITERS: C-ANCA: <1:20 {titer}

## 2017-08-08 LAB — ANTINUCLEAR ANTIBODIES, IFA: ANTINUCLEAR ANTIBODIES, IFA: NEGATIVE

## 2017-08-08 LAB — MULTIPLE MYELOMA PANEL, SERUM
ALBUMIN SERPL ELPH-MCNC: 3.9 g/dL (ref 2.9–4.4)
ALPHA 1: 0.3 g/dL (ref 0.0–0.4)
Albumin/Glob SerPl: 1.3 (ref 0.7–1.7)
Alpha2 Glob SerPl Elph-Mcnc: 0.8 g/dL (ref 0.4–1.0)
B-GLOBULIN SERPL ELPH-MCNC: 0.9 g/dL (ref 0.7–1.3)
GLOBULIN, TOTAL: 3.1 g/dL (ref 2.2–3.9)
Gamma Glob SerPl Elph-Mcnc: 1.1 g/dL (ref 0.4–1.8)
IGA: 134 mg/dL (ref 90–386)
IgG (Immunoglobin G), Serum: 962 mg/dL (ref 700–1600)
IgM (Immunoglobulin M), Srm: 121 mg/dL (ref 20–172)
TOTAL PROTEIN ELP: 7 g/dL (ref 6.0–8.5)

## 2017-08-08 LAB — BETA-2-GLYCOPROTEIN I ABS, IGG/M/A
Beta-2 Glyco I IgG: <9 GPI IgG units (ref 0–20)
Beta-2-Glycoprotein I IgM: <9 GPI IgM units (ref 0–32)

## 2017-08-08 LAB — HOMOCYSTEINE: Homocysteine: 11.7 umol/L (ref 0.0–15.0)

## 2017-08-09 DIAGNOSIS — N182 Chronic kidney disease, stage 2 (mild): Secondary | ICD-10-CM | POA: Diagnosis not present

## 2017-08-09 DIAGNOSIS — I129 Hypertensive chronic kidney disease with stage 1 through stage 4 chronic kidney disease, or unspecified chronic kidney disease: Secondary | ICD-10-CM | POA: Diagnosis not present

## 2017-08-13 LAB — PROTHROMBIN GENE MUTATION

## 2017-08-13 LAB — FACTOR 5 LEIDEN

## 2017-08-22 LAB — JAK2 (INCLUDING V617F AND EXON 12), MPL,& CALR-NEXT GEN SEQ

## 2017-08-23 ENCOUNTER — Ambulatory Visit (INDEPENDENT_AMBULATORY_CARE_PROVIDER_SITE_OTHER): Payer: 59 | Admitting: Physician Assistant

## 2017-08-23 ENCOUNTER — Encounter: Payer: Self-pay | Admitting: Physician Assistant

## 2017-08-23 VITALS — BP 108/75 | HR 66 | Ht 70.0 in | Wt 193.0 lb

## 2017-08-23 DIAGNOSIS — N280 Ischemia and infarction of kidney: Secondary | ICD-10-CM

## 2017-08-23 MED ORDER — LISINOPRIL 5 MG PO TABS: 5.0000 mg | ORAL_TABLET | Freq: Every day | ORAL | 3 refills | Status: DC

## 2017-08-23 NOTE — Patient Instructions (Addendum)
Medication Instructions: Your physician recommends that you continue on your current medications as directed. Please refer to the Current Medication list given to you today.  Labwork: Your physician recommends that you return for lab work within 3 days prior to your procedure: BMET, CBC   Testing/Procedures:  You are scheduled for a TEE on 08/29/17 with Dr. Stanford Breed.  Please arrive at the [facility] (Main Entrance A) at [facility]: [address] [place], [place] [address] at 7:30 am for an 8:30 am procedure.   DIET: Nothing to eat or drink after midnight except a sip of water with medications (see medication instructions below)  Medication Instructions:  Continue your anticoagulant: Xarelto You will need to continue your anticoagulant after your procedure until you are told by your  Provider that it is safe to stop   You must have a responsible person to drive you home and stay in the waiting area during your procedure. Failure to do so could result in cancellation.  Bring your insurance cards.  *Special Note: Every effort is made to have your procedure done on time. Occasionally there are emergencies that occur at the hospital that may cause delays. Please be patient if a delay does occur.

## 2017-08-23 NOTE — Progress Notes (Signed)
Cardiology Office Note    Date:  08/24/2017   ID:  Glen Rollins, DOB [birth year], MRN [id number]  PCP:  Glen Pepper, MD  Cardiologist: New- case discussed with DOD Dr. Ellyn Hack.  Chief Complaint  Patient presents with  . New Patient (Initial Visit)    seen for Dr. Ellyn Hack. Renal infarction    History of Present Illness:  Glen Rollins is a 47 y.o. male with no significant past medical history who recently presented to the urgent care on 06/28/2017 with epigastric abdominal pain.  Examination showed focal tenderness over the epigastrium and left upper quadrant.  Symptom was felt to be related to peptic ulcer disease and he was prescribed Protonix, Carafate and Bentyl.  He took 1 dose of medication, however without relief.  After he called urgent care again, he was instructed to come to the emergency room for further evaluation.  Initial blood pressure was elevated.  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  Patient was also noted to have acute kidney injury with creatinine of 1.43.  Lactic acid level was elevated at 2.25 on admission.  White blood cell within normal limit at 10.4.  He also complained of a severe headache with visual changes prior to the abdominal pain.  Blood culture was negative x2.  Urinalysis was negative as well.  Urine drug screen was only positive for opioids.  So far he had negative sedimentation rate, normal ANA, negative anticardiolipin antibody, normal PTT lupus anticoagulant level.  HIV negative.  Genetic testing for factor V Leiden and thrombophilia was negative as well.  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  The underlying etiology of renal infarct was never elucidated.  Echocardiogram obtained on 06/30/2017 showed EF 55 to 60%, mildly dilated left atrium, bubble study did not show any atrial septal defect or patent foramen ovale.  Patient was referred to  cardiology office on 08/23/2017 for further evaluation of recent renal infarct by hematology service Dr. Irene Limbo.  He says he did have severe headache prior to the hospitalization however this has resolved.  He has completed the starting pack of Xarelto at 15 mg twice daily and has since transition to 20 mg daily of Xarelto.  He has an apple watch and has not noticed a significant arrhythmia recently.  He denies any palpitation, chest pain, exertional shortness of breath.  He denies any fever or chill prior to the hospitalization however did have some chills after he came home.  Due to elevation of blood pressure after the hospitalization, he was initially placed on 10 mg lisinopril-HCTZ, but this was later reduced to lisinopril 5 mg daily.  He is feeling well on today's visit.  I discussed the case with Dr. Ellyn Hack.  Although patient had extensive hypercoagulation lab and genetic testing along with negative blood culture, potential differential diagnosis include embolic stroke as result of undiagnosed atrial fibrillation versus aseptic endocarditis such as Libmen-Sacks endocarditis which commonly have negative blood culture anyway.  To further assess, we recommend transesophageal echocardiogram.  If TEE is negative for any embolic source, then a loop recorder would be reasonable for chronic monitoring of atrial fibrillation and to help guide the duration of systemic anticoagulation therapy.    History reviewed. No pertinent past medical history.  Past Surgical History:  Procedure Laterality Date  . SHOULDER SURGERY      Current Medications: Outpatient Medications Prior to Visit  Medication Sig Dispense  Refill  . XARELTO 20 MG TABS tablet Take 20 mg by mouth daily with supper.     Marland Kitchen lisinopril (PRINIVIL,ZESTRIL) 5 MG tablet Take 5 mg by mouth daily.    Marland Kitchen dicyclomine (BENTYL) 20 MG tablet Take 20 mg by mouth 3 (three) times daily as needed for spasms.    Marland Kitchen docusate sodium (COLACE) 100 MG capsule Take 1  capsule (100 mg total) by mouth 2 (two) times daily. 10 capsule 0  . omeprazole (PRILOSEC) 20 MG capsule Take 40 mg by mouth daily.    Marland Kitchen oxyCODONE-acetaminophen (PERCOCET/ROXICET) 5-325 MG tablet Take 1-2 tablets by mouth every 4 (four) hours as needed for moderate pain. 30 tablet 0  . Rivaroxaban 15 & 20 MG TBPK Take as directed on package: Start with one '15mg'$  tablet by mouth twice a day with food. On Day 22, switch to one '20mg'$  tablet once a day with food. 51 each 0  . sucralfate (CARAFATE) 1 g tablet Take 1 g by mouth 4 (four) times daily.     No facility-administered medications prior to visit.      Allergies:   Patient has no known allergies.   Social History   Socioeconomic History  . Marital status: Married    Spouse name: Not on file  . Number of children: Not on file  . Years of education: Not on file  . Highest education level: Not on file  Occupational History  . Not on file  Social Needs  . Financial resource strain: Not on file  . Food insecurity:    Worry: Not on file    Inability: Not on file  . Transportation needs:    Medical: Not on file    Non-medical: Not on file  Tobacco Use  . Smoking status: Never Smoker  . Smokeless tobacco: Never Used  Substance and Sexual Activity  . Alcohol use: Yes  . Drug use: Never  . Sexual activity: Not on file  Lifestyle  . Physical activity:    Days per week: Not on file    Minutes per session: Not on file  . Stress: Not on file  Relationships  . Social connections:    Talks on phone: Not on file    Gets together: Not on file    Attends religious service: Not on file    Active member of club or organization: Not on file    Attends meetings of clubs or organizations: Not on file    Relationship status: Not on file  Other Topics Concern  . Not on file  Social History Narrative  . Not on file     Family History:  The patient's family history includes Aortic stenosis in his brother; Arrhythmia in his brother;  Hypertension in his father; Lung cancer in his mother; Lupus in his mother.   ROS:   Please see the history of present illness.    ROS All other systems reviewed and are negative.   PHYSICAL EXAM:   VS:  BP 108/75 (BP Location: Right Arm, Cuff Size: Normal)   Pulse 66   Ht '5\' 10"'$  (1.778 m)   Wt 193 lb (87.5 kg)   BMI 27.69 kg/m    GEN: Well nourished, well developed, in no acute distress  HEENT: normal  Neck: no JVD, carotid bruits, or masses Cardiac: RRR; no murmurs, rubs, or gallops,no edema  Respiratory:  clear to auscultation bilaterally, normal work of breathing GI: soft, nontender, nondistended, + BS MS: no deformity or atrophy  Skin: warm and dry, no rash Neuro:  Alert and Oriented x 3, Strength and sensation are intact Psych: euthymic mood, full affect  Wt Readings from Last 3 Encounters:  08/23/17 193 lb (87.5 kg)  08/07/17 194 lb 9.6 oz (88.3 kg)  06/29/17 201 lb 3.2 oz (91.3 kg)      Studies/Labs Reviewed:   EKG:  EKG is not ordered today.    Recent Labs: 06/28/2017: ALT 22 06/29/2017: TSH 1.459 06/30/2017: BUN 8; Creatinine, Ser 1.25; Hemoglobin 12.4; Platelets 203; Potassium 3.5; Sodium 136   Lipid Panel No results found for: CHOL, TRIG, HDL, CHOLHDL, VLDL, LDLCALC, LDLDIRECT  Additional studies/ records that were reviewed today include:   Echo 06/30/2017 LV EF: 55% -   60% Study Conclusions  - Left ventricle: The cavity size was normal. Systolic function was   normal. The estimated ejection fraction was in the range of 55%   to 60%. Wall motion was normal; there were no regional wall   motion abnormalities. Left ventricular diastolic function   parameters were normal. - Left atrium: The atrium was mildly dilated. - Atrial septum: No defect or patent foramen ovale was identified.   Echo contrast study showed no right-to-left atrial level shunt,   at baseline or with provocation.   ASSESSMENT:    1. Renal infarct ([place])      PLAN:  In order of  problems listed above:  1. Renal infarct: Unclear cause, presumed to be embolic in nature as CT of abdomen revealed 2 renal arteries were occluded.  So far hypercoagulation studies include ANA, anticardiolipin panel, ESR, lupus anticoagulation panel were all negative.  Genetic testing was negative for factor V Leiden and thrombophilia.  Differential diagnosis include undiagnosed atrial fibrillation versus aseptic endocarditis such as Libman-Sacks endocarditis.  Recommend transesophageal echocardiogram, if negative, proceed with implantation of loop recorder.  Patient was started on Xarelto 15 mg twice daily however later transitioned to 20 mg daily.  No bleeding issues currently. - Risk and benefit of transesophageal echocardiogram has been discussed with the patient include risk associated with sedation and esophageal trauma.  Potential probability of complication during the procedure is weighing 10,000 chances of hypertension, bradycardia, aspiration event, esophageal bleeding, hematoma or esophageal rupture.  -Patient has been followed by hematology/oncology and also nephrology with Dr. Posey Pronto. -We have set the patient up for TEE for next Wednesday, I will reach out to the electrophysiology team to see a loop recorder can be placed next Wednesday and whether or not the patient will need to hold Xarelto prior to the procedure.  2. High blood pressure: No prior diagnosis of hypertension in the past, blood pressure was only elevated after the recent hospitalization.  Currently on lisinopril not for blood pressure but more so for renal protection effect.     Medication Adjustments/Labs and Tests Ordered: Current medicines are reviewed at length with the patient today.  Concerns regarding medicines are outlined above.  Medication changes, Labs and Tests ordered today are listed in the Patient Instructions below. Patient Instructions  Medication Instructions: Your physician recommends that you continue  on your current medications as directed. Please refer to the Current Medication list given to you today.  Labwork: Your physician recommends that you return for lab work within 3 days prior to your procedure: BMET, CBC   Testing/Procedures:  You are scheduled for a TEE on 08/29/17 with Dr. Stanford Breed.  Please arrive at the [facility] (Main Entrance A) at [facility]: [address]  [place], [place] [address] at 7:30 am for an 8:30 am procedure.   DIET: Nothing to eat or drink after midnight except a sip of water with medications (see medication instructions below)  Medication Instructions:  Continue your anticoagulant: Xarelto You will need to continue your anticoagulant after your procedure until you are told by your  Provider that it is safe to stop   You must have a responsible person to drive you home and stay in the waiting area during your procedure. Failure to do so could result in cancellation.  Bring your insurance cards.  *Special Note: Every effort is made to have your procedure done on time. Occasionally there are emergencies that occur at the hospital that may cause delays. Please be patient if a delay does occur.       Hilbert Corrigan, [place]  08/24/2017 12:33 PM    [facility] [place], [place], [place]  [address] Phone: [phone]; Fax: [phone]

## 2017-08-24 ENCOUNTER — Other Ambulatory Visit: Payer: Self-pay | Admitting: Physician Assistant

## 2017-08-24 ENCOUNTER — Telehealth: Payer: Self-pay

## 2017-08-24 NOTE — Telephone Encounter (Signed)
-----   Message from [place], [place] sent at 08/24/2017  3:01 PM EDT ----- Regarding: TEE and loop recorder next week Please inform the patient that our EP provider will see him after the TEE procedure on Wednesday to discuss loop recorder. No need to hold Xarelto prior to the procedure per EP provider on call.  I called him twice today and have not been able to reach him  Signed, Almyra Deforest PA Pager: [phone]

## 2017-08-24 NOTE — Telephone Encounter (Signed)
Patient notified of Hao's recommendation and voiced understanding

## 2017-08-28 DIAGNOSIS — N280 Ischemia and infarction of kidney: Secondary | ICD-10-CM | POA: Diagnosis not present

## 2017-08-29 ENCOUNTER — Encounter (HOSPITAL_COMMUNITY)
Admission: RE | Disposition: A | Discharge status: Home / Self Care | Payer: Self-pay | Source: Ambulatory Visit | Attending: Cardiology

## 2017-08-29 ENCOUNTER — Encounter (HOSPITAL_COMMUNITY): Payer: Self-pay | Admitting: Cardiology

## 2017-08-29 ENCOUNTER — Ambulatory Visit (HOSPITAL_COMMUNITY)
Admission: RE | Admit: 2017-08-29 | Discharge: 2017-08-29 | Disposition: A | Discharge status: Home / Self Care | Payer: 59 | Source: Ambulatory Visit | Attending: Cardiology | Admitting: Cardiology

## 2017-08-29 ENCOUNTER — Ambulatory Visit (HOSPITAL_BASED_OUTPATIENT_CLINIC_OR_DEPARTMENT_OTHER): Payer: 59

## 2017-08-29 DIAGNOSIS — R030 Elevated blood-pressure reading, without diagnosis of hypertension: Secondary | ICD-10-CM | POA: Diagnosis not present

## 2017-08-29 DIAGNOSIS — Z9889 Other specified postprocedural states: Secondary | ICD-10-CM | POA: Diagnosis not present

## 2017-08-29 DIAGNOSIS — I6340 Cerebral infarction due to embolism of unspecified cerebral artery: Secondary | ICD-10-CM | POA: Diagnosis not present

## 2017-08-29 DIAGNOSIS — Z8249 Family history of ischemic heart disease and other diseases of the circulatory system: Secondary | ICD-10-CM | POA: Diagnosis not present

## 2017-08-29 DIAGNOSIS — I340 Nonrheumatic mitral (valve) insufficiency: Secondary | ICD-10-CM | POA: Diagnosis not present

## 2017-08-29 DIAGNOSIS — Z7901 Long term (current) use of anticoagulants: Secondary | ICD-10-CM | POA: Insufficient documentation

## 2017-08-29 DIAGNOSIS — N280 Ischemia and infarction of kidney: Secondary | ICD-10-CM | POA: Insufficient documentation

## 2017-08-29 DIAGNOSIS — N179 Acute kidney failure, unspecified: Secondary | ICD-10-CM | POA: Insufficient documentation

## 2017-08-29 DIAGNOSIS — Z79899 Other long term (current) drug therapy: Secondary | ICD-10-CM | POA: Diagnosis not present

## 2017-08-29 HISTORY — PX: TEE WITHOUT CARDIOVERSION: SHX5443

## 2017-08-29 HISTORY — PX: LOOP RECORDER INSERTION: EP1214

## 2017-08-29 LAB — CBC WITH DIFFERENTIAL/PLATELET
BASOS ABS: 0 10*3/uL (ref 0.0–0.2)
Basos: 1 %
EOS (ABSOLUTE): 0.3 10*3/uL (ref 0.0–0.4)
EOS: 3 %
HEMATOCRIT: 42 % (ref 37.5–51.0)
HEMOGLOBIN: 13.9 g/dL (ref 13.0–17.7)
IMMATURE GRANULOCYTES: 0 %
Immature Grans (Abs): 0 10*3/uL (ref 0.0–0.1)
Lymphocytes Absolute: 2.4 10*3/uL (ref 0.7–3.1)
Lymphs: 29 %
MCH: 29.8 pg (ref 26.6–33.0)
MCHC: 33.1 g/dL (ref 31.5–35.7)
MCV: 90 fL (ref 79–97)
MONOCYTES: 9 %
Monocytes Absolute: 0.8 10*3/uL (ref 0.1–0.9)
NEUTROS PCT: 58 %
Neutrophils Absolute: 4.8 10*3/uL (ref 1.4–7.0)
Platelets: 313 10*3/uL (ref 150–450)
RBC: 4.66 x10E6/uL (ref 4.14–5.80)
RDW: 13.9 % (ref 12.3–15.4)
WBC: 8.3 10*3/uL (ref 3.4–10.8)

## 2017-08-29 LAB — BASIC METABOLIC PANEL
BUN/Creatinine Ratio: 11 (ref 9–20)
BUN: 14 mg/dL (ref 6–24)
CALCIUM: 9.9 mg/dL (ref 8.7–10.2)
CHLORIDE: 100 mmol/L (ref 96–106)
CO2: 23 mmol/L (ref 20–29)
CREATININE: 1.23 mg/dL (ref 0.76–1.27)
GFR calc Af Amer: 81 mL/min/{1.73_m2} (ref >59)
GFR calc non Af Amer: 70 mL/min/{1.73_m2} (ref >59)
Glucose: 78 mg/dL (ref 65–99)
Potassium: 4.3 mmol/L (ref 3.5–5.2)
Sodium: 140 mmol/L (ref 134–144)

## 2017-08-29 SURGERY — ECHOCARDIOGRAM, TRANSESOPHAGEAL
Anesthesia: Moderate Sedation

## 2017-08-29 SURGERY — LOOP RECORDER INSERTION
Anesthesia: LOCAL

## 2017-08-29 MED ORDER — SODIUM CHLORIDE BACTERIOSTATIC 0.9 % IJ SOLN
INTRAMUSCULAR | Status: DC | PRN
  Administered 2017-08-29: 9 mL via INTRAVENOUS

## 2017-08-29 MED ORDER — SODIUM CHLORIDE 0.9 % IV SOLN
INTRAVENOUS | Status: DC
  Administered 2017-08-29: 08:00:00 via INTRAVENOUS

## 2017-08-29 MED ORDER — FENTANYL CITRATE (PF) 100 MCG/2ML IJ SOLN
INTRAMUSCULAR | Status: DC | PRN
  Administered 2017-08-29 (×3): 25 ug via INTRAVENOUS

## 2017-08-29 MED ORDER — BUTAMBEN-TETRACAINE-BENZOCAINE 2-2-14 % EX AERO
INHALATION_SPRAY | CUTANEOUS | Status: DC | PRN
  Administered 2017-08-29: 2 via TOPICAL

## 2017-08-29 MED ORDER — LIDOCAINE-EPINEPHRINE 1 %-1:100000 IJ SOLN
INTRAMUSCULAR | Status: DC | PRN
  Administered 2017-08-29: 30 mL

## 2017-08-29 MED ORDER — DIPHENHYDRAMINE HCL 50 MG/ML IJ SOLN
INTRAMUSCULAR | Status: DC | PRN
  Administered 2017-08-29: 25 mg via INTRAVENOUS

## 2017-08-29 MED ORDER — LIDOCAINE-EPINEPHRINE 1 %-1:100000 IJ SOLN
INTRAMUSCULAR | Status: AC
  Filled 2017-08-29: qty 1

## 2017-08-29 MED ORDER — MIDAZOLAM HCL 5 MG/ML IJ SOLN
INTRAMUSCULAR | Status: AC
  Filled 2017-08-29: qty 2

## 2017-08-29 MED ORDER — DIPHENHYDRAMINE HCL 50 MG/ML IJ SOLN
INTRAMUSCULAR | Status: AC
  Filled 2017-08-29: qty 1

## 2017-08-29 MED ORDER — MIDAZOLAM HCL 10 MG/2ML IJ SOLN
INTRAMUSCULAR | Status: DC | PRN
  Administered 2017-08-29 (×3): 2 mg via INTRAVENOUS
  Administered 2017-08-29: 1 mg via INTRAVENOUS

## 2017-08-29 MED ORDER — FENTANYL CITRATE (PF) 100 MCG/2ML IJ SOLN
INTRAMUSCULAR | Status: AC
  Filled 2017-08-29: qty 2

## 2017-08-29 SURGICAL SUPPLY — 2 items
LOOP REVEAL LINQSYS (Prosthesis & Implant Heart) ×2 IMPLANT
PACK LOOP INSERTION (CUSTOM PROCEDURE TRAY) ×2 IMPLANT

## 2017-08-29 NOTE — Discharge Instructions (Signed)
TEE  YOU HAD AN CARDIAC PROCEDURE TODAY: Refer to the procedure report and other information in the discharge instructions given to you for any specific questions about what was found during the examination. If this information does not answer your questions, please call [facility] office at [phone] to clarify.   DIET: Your first meal following the procedure should be a light meal and then it is ok to progress to your normal diet. A half-sandwich or bowl of soup is an example of a good first meal. Heavy or fried foods are harder to digest and may make you feel nauseous or bloated. Drink plenty of fluids but you should avoid alcoholic beverages for 24 hours. If you had a esophageal dilation, please see attached instructions for diet.   ACTIVITY: Your care partner should take you home directly after the procedure. You should plan to take it easy, moving slowly for the rest of the day. You can resume normal activity the day after the procedure however YOU SHOULD NOT DRIVE, use power tools, machinery or perform tasks that involve climbing or major physical exertion for 24 hours (because of the sedation medicines used during the test).   SYMPTOMS TO REPORT IMMEDIATELY: A cardiologist can be reached at any hour. Please call [phone] for any of the following symptoms:  Vomiting of blood or coffee ground material  New, significant abdominal pain  New, significant chest pain or pain under the shoulder blades  Painful or persistently difficult swallowing  New shortness of breath  Black, tarry-looking or red, bloody stools  FOLLOW UP:  Please also call with any specific questions about appointments or follow up tests.  Post implant site care instructions Keep incision clean and dry for 3 days. You can remove outer dressing tomorrow. Leave steri-strips (little pieces of tape) on until seen in the office for wound check appointment. Call the office [phone]) for redness, drainage, swelling,  or fever.

## 2017-08-29 NOTE — Interval H&P Note (Signed)
History and Physical Interval Note:  08/29/2017 11:52 AM  Rozanna Boer  has presented today for surgery, with the diagnosis of Renal Infarct  The various methods of treatment have been discussed with the patient and family. After consideration of risks, benefits and other options for treatment, the patient has consented to  Procedure(s): LOOP RECORDER INSERTION (N/A) as a surgical intervention .  The patient's history has been reviewed, patient examined, no change in status, stable for surgery.  I have reviewed the patient's chart and labs.  Questions were answered to the patient's satisfaction.     Thompson Grayer

## 2017-08-29 NOTE — Progress Notes (Signed)
  Echocardiogram Echocardiogram Transesophageal has been performed.  Glen Rollins 08/29/2017, 9:21 AM

## 2017-08-29 NOTE — H&P (Signed)
Office Visit   08/23/2017 [facility] Toni Amend, [place]  Cardiology   Renal infarct [facility])  Dx   New Patient (Initial Visit)   ; Referred by [name], MD  Reason for Visit   Additional Documentation   Vitals:   BP 108/75 (BP Location: Right Arm, Cuff Size: Normal)   Pulse 66   Ht '5\' 10"'$  (1.778 m)   Wt 193 lb (87.5 kg)   BMI 27.69 kg/m   BSA 2.08 m      More Vitals   Flowsheets:   Anthropometrics,   MEWS Score     Encounter Info:   Billing Info,   History,   Allergies,   Detailed Report     All Notes   Progress Notes by Almyra Deforest, PA at 08/23/2017 3:00 PM  Author: Almyra Deforest, PA Author Type: Physician Assistant Filed: 08/29/2017 7:34 AM  Note Status: Signed Cosign: Cosign Not Required Encounter Date: 08/23/2017  Editor: Almyra Deforest, PA (Physician Assistant)    Normal lab, no contraindication with today's procedure.    Progress Notes by Almyra Deforest, [place] at 08/23/2017 3:00 PM  Author: Almyra Deforest, PA Author Type: Physician Assistant Filed: 08/24/2017 12:33 PM  Note Status: Signed Cosign: Cosign Not Required Encounter Date: 08/23/2017  Editor: Almyra Deforest, [place] (Physician Assistant)  Expand All Collapse All      Cardiology Office Note    Date:  08/24/2017   ID:  Akeen Ledyard, DOB 18-Feb-1970, MRN [id number]  PCP:  [name], MD       Cardiologist: New- case discussed with DOD Dr. Ellyn Hack.      Chief Complaint  Patient presents with  . New Patient (Initial Visit)    seen for Dr. Ellyn Hack. Renal infarction    History of Present Illness:  Khoen Genet is a 47 y.o. male with no significant past medical history who recently presented to the urgent care on 06/28/2017 with epigastric abdominal pain.  Examination showed focal tenderness over the epigastrium and left upper quadrant.  Symptom was felt to be related to peptic ulcer disease and he was prescribed Protonix, Carafate and Bentyl.  He took 1 dose of medication, however without relief.   After he called urgent care again, he was instructed to come to the emergency room for further evaluation.  Initial blood pressure was elevated.  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  Patient was also noted to have acute kidney injury with creatinine of 1.43.  Lactic acid level was elevated at 2.25 on admission.  White blood cell within normal limit at 10.4.  He also complained of a severe headache with visual changes prior to the abdominal pain.  Blood culture was negative x2.  Urinalysis was negative as well.  Urine drug screen was only positive for opioids.  So far he had negative sedimentation rate, normal ANA, negative anticardiolipin antibody, normal PTT lupus anticoagulant level.  HIV negative.  Genetic testing for factor V Leiden and thrombophilia was negative as well.  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  The underlying etiology of renal infarct was never elucidated.  Echocardiogram obtained on 06/30/2017 showed EF 55 to 60%, mildly dilated left atrium, bubble study did not show any atrial septal defect or patent foramen ovale.  Patient was referred to cardiology office on 08/23/2017 for further evaluation of recent renal infarct by hematology service Dr. Irene Limbo.  He says  he did have severe headache prior to the hospitalization however this has resolved.  He has completed the starting pack of Xarelto at 15 mg twice daily and has since transition to 20 mg daily of Xarelto.  He has an apple watch and has not noticed a significant arrhythmia recently.  He denies any palpitation, chest pain, exertional shortness of breath.  He denies any fever or chill prior to the hospitalization however did have some chills after he came home.  Due to elevation of blood pressure after the hospitalization, he was initially placed on 10 mg lisinopril-HCTZ, but this was later reduced to lisinopril 5 mg daily.  He is feeling well  on today's visit.  I discussed the case with Dr. Ellyn Hack.  Although patient had extensive hypercoagulation lab and genetic testing along with negative blood culture, potential differential diagnosis include embolic stroke as result of undiagnosed atrial fibrillation versus aseptic endocarditis such as Libmen-Sacks endocarditis which commonly have negative blood culture anyway.  To further assess, we recommend transesophageal echocardiogram.  If TEE is negative for any embolic source, then a loop recorder would be reasonable for chronic monitoring of atrial fibrillation and to help guide the duration of systemic anticoagulation therapy.    History reviewed. No pertinent past medical history.       Past Surgical History:  Procedure Laterality Date  . SHOULDER SURGERY      Current Medications:       Outpatient Medications Prior to Visit  Medication Sig Dispense Refill  . XARELTO 20 MG TABS tablet Take 20 mg by mouth daily with supper.     Marland Kitchen lisinopril (PRINIVIL,ZESTRIL) 5 MG tablet Take 5 mg by mouth daily.    Marland Kitchen dicyclomine (BENTYL) 20 MG tablet Take 20 mg by mouth 3 (three) times daily as needed for spasms.    Marland Kitchen docusate sodium (COLACE) 100 MG capsule Take 1 capsule (100 mg total) by mouth 2 (two) times daily. 10 capsule 0  . omeprazole (PRILOSEC) 20 MG capsule Take 40 mg by mouth daily.    Marland Kitchen oxyCODONE-acetaminophen (PERCOCET/ROXICET) 5-325 MG tablet Take 1-2 tablets by mouth every 4 (four) hours as needed for moderate pain. 30 tablet 0  . Rivaroxaban 15 & 20 MG TBPK Take as directed on package: Start with one '15mg'$  tablet by mouth twice a day with food. On Day 22, switch to one '20mg'$  tablet once a day with food. 51 each 0  . sucralfate (CARAFATE) 1 g tablet Take 1 g by mouth 4 (four) times daily.     No facility-administered medications prior to visit.      Allergies:   Patient has no known allergies.   Social History        Socioeconomic History  . Marital status:  Married    Spouse name: Not on file  . Number of children: Not on file  . Years of education: Not on file  . Highest education level: Not on file  Occupational History  . Not on file  Social Needs  . Financial resource strain: Not on file  . Food insecurity:    Worry: Not on file    Inability: Not on file  . Transportation needs:    Medical: Not on file    Non-medical: Not on file  Tobacco Use  . Smoking status: Never Smoker  . Smokeless tobacco: Never Used  Substance and Sexual Activity  . Alcohol use: Yes  . Drug use: Never  . Sexual activity: Not on file  Lifestyle  .  Physical activity:    Days per week: Not on file    Minutes per session: Not on file  . Stress: Not on file  Relationships  . Social connections:    Talks on phone: Not on file    Gets together: Not on file    Attends religious service: Not on file    Active member of club or organization: Not on file    Attends meetings of clubs or organizations: Not on file    Relationship status: Not on file  Other Topics Concern  . Not on file  Social History Narrative  . Not on file     Family History:  The patient's family history includes Aortic stenosis in his brother; Arrhythmia in his brother; Hypertension in his father; Lung cancer in his mother; Lupus in his mother.   ROS:   Please see the history of present illness.    ROS All other systems reviewed and are negative.   PHYSICAL EXAM:   VS:  BP 108/75 (BP Location: Right Arm, Cuff Size: Normal)   Pulse 66   Ht '5\' 10"'$  (1.778 m)   Wt 193 lb (87.5 kg)   BMI 27.69 kg/m    GEN: Well nourished, well developed, in no acute distress  HEENT: normal  Neck: no JVD, carotid bruits, or masses Cardiac: RRR; no murmurs, rubs, or gallops,no edema  Respiratory:  clear to auscultation bilaterally, normal work of breathing GI: soft, nontender, nondistended, + BS MS: no deformity or atrophy  Skin: warm and dry, no rash Neuro:  Alert  and Oriented x 3, Strength and sensation are intact Psych: euthymic mood, full affect     Wt Readings from Last 3 Encounters:  08/23/17 193 lb (87.5 kg)  08/07/17 194 lb 9.6 oz (88.3 kg)  06/29/17 201 lb 3.2 oz (91.3 kg)      Studies/Labs Reviewed:   EKG:  EKG is not ordered today.    Recent Labs: 06/28/2017: ALT 22 06/29/2017: TSH 1.459 06/30/2017: BUN 8; Creatinine, Ser 1.25; Hemoglobin 12.4; Platelets 203; Potassium 3.5; Sodium 136   Lipid Panel Labs (Brief)  No results found for: CHOL, TRIG, HDL, CHOLHDL, VLDL, LDLCALC, LDLDIRECT    Additional studies/ records that were reviewed today include:   Echo 06/30/2017 LV EF: 55% - 60% Study Conclusions  - Left ventricle: The cavity size was normal. Systolic function was normal. The estimated ejection fraction was in the range of 55% to 60%. Wall motion was normal; there were no regional wall motion abnormalities. Left ventricular diastolic function parameters were normal. - Left atrium: The atrium was mildly dilated. - Atrial septum: No defect or patent foramen ovale was identified. Echo contrast study showed no right-to-left atrial level shunt, at baseline or with provocation.   ASSESSMENT:    1. Renal infarct ([place])      PLAN:  In order of problems listed above:  1. Renal infarct: Unclear cause, presumed to be embolic in nature as CT of abdomen revealed 2 renal arteries were occluded.  So far hypercoagulation studies include ANA, anticardiolipin panel, ESR, lupus anticoagulation panel were all negative.  Genetic testing was negative for factor V Leiden and thrombophilia.  Differential diagnosis include undiagnosed atrial fibrillation versus aseptic endocarditis such as Libman-Sacks endocarditis.  Recommend transesophageal echocardiogram, if negative, proceed with implantation of loop recorder.  Patient was started on Xarelto 15 mg twice daily however later transitioned to 20 mg daily.  No  bleeding issues currently. - Risk and benefit of transesophageal echocardiogram  has been discussed with the patient include risk associated with sedation and esophageal trauma.  Potential probability of complication during the procedure is weighing 10,000 chances of hypertension, bradycardia, aspiration event, esophageal bleeding, hematoma or esophageal rupture.  -Patient has been followed by hematology/oncology and also nephrology with Dr. Posey Pronto. -We have set the patient up for TEE for next Wednesday, I will reach out to the electrophysiology team to see a loop recorder can be placed next Wednesday and whether or not the patient will need to hold Xarelto prior to the procedure.  2. High blood pressure: No prior diagnosis of hypertension in the past, blood pressure was only elevated after the recent hospitalization.  Currently on lisinopril not for blood pressure but more so for renal protection effect.     Medication Adjustments/Labs and Tests Ordered: Current medicines are reviewed at length with the patient today.  Concerns regarding medicines are outlined above.  Medication changes, Labs and Tests ordered today are listed in the Patient Instructions below. Patient Instructions  Medication Instructions: Your physician recommends that you continue on your current medications as directed. Please refer to the Current Medication list given to you today.  Labwork: Your physician recommends that you return for lab work within 3 days prior to your procedure: BMET, CBC   Testing/Procedures:  You are scheduled for a TEE on 08/29/17 with Dr. Stanford Breed.  Please arrive at the [facility] (Main Entrance A) at [facility]: [address] [place], [place] [address] at 7:30 am for an 8:30 am procedure.   DIET: Nothing to eat or drink after midnight except a sip of water with medications (see medication instructions below)  Medication Instructions:  Continue your anticoagulant:  Xarelto You will need to continue your anticoagulant after your procedure until you are told by your  Provider that it is safe to stop   You must have a responsible person to drive you home and stay in the waiting area during your procedure. Failure to do so could result in cancellation.  Bring your insurance cards.  *Special Note: Every effort is made to have your procedure done on time. Occasionally there are emergencies that occur at the hospital that may cause delays. Please be patient if a delay does occur.       Hilbert Corrigan, [place]  08/24/2017 12:33 PM    [facility] [place], [place],   [address] Phone: [phone]; Fax: [phone]      For TEE to R/O SOE; no changes. Kirk Ruths

## 2017-08-29 NOTE — Interval H&P Note (Signed)
History and Physical Interval Note:  08/29/2017 7:49 AM  Glen Rollins  has presented today for surgery, with the diagnosis of renal insarct  The various methods of treatment have been discussed with the patient and family. After consideration of risks, benefits and other options for treatment, the patient has consented to  Procedure(s): TRANSESOPHAGEAL ECHOCARDIOGRAM (TEE) (N/A) as a surgical intervention .  The patient's history has been reviewed, patient examined, no change in status, stable for surgery.  I have reviewed the patient's chart and labs.  Questions were answered to the patient's satisfaction.     Kirk Ruths

## 2017-08-29 NOTE — Progress Notes (Signed)
    Transesophageal Echocardiogram Note  Glen Rollins [id number] 04-May-1970  Procedure: Transesophageal Echocardiogram Indications: R/O SOE in pt with renal infarct  Procedure Details Consent: Obtained Time Out: Verified patient identification, verified procedure, site/side was marked, verified correct patient position, special equipment/implants available, Radiology Safety Procedures followed,  medications/allergies/relevent history reviewed, required imaging and test results available.  Performed  Medications:  During this procedure the patient is administered a total of Versed 7 mg, benadryl 25 mg and Fentanyl 75 mcg  to achieve and maintain moderate conscious sedation.  The patient's heart rate, blood pressure, and oxygen saturation are monitored continuously during the procedure. The period of conscious sedation is 30 minutes, of which I was present face-to-face 100% of this time.  Normal LV function; no LAA thrombus; no vegetations; negative saline microcavitation study.   Complications: No apparent complications Patient did tolerate procedure well.  Kirk Ruths, MD

## 2017-08-29 NOTE — H&P (View-Only) (Signed)
ELECTROPHYSIOLOGY CONSULT NOTE  Patient ID: Glen Rollins MRN: [id number], DOB/AGE: [birth year]   Admit date: 08/29/2017 Date of Consult: 08/29/2017  Primary Physician: [name], MD Primary Cardiologist: Dr. Ellyn Hack Reason for Consultation: recommendations regarding Implantable Loop Recorder, requested by Dr. Ellyn Hack  History of Present Illness Glen Rollins in review of his cardiology consult note that was very thorough by H. [place], [place], he noted:  The patient has no significant past medical history who was initially seen at an [facility] on 06/28/2017 with epigastric abdominal pain. Symptom was felt to be related to peptic ulcer disease and he was prescribed Protonix, Carafate and Bentyl.  He took 1 dose of medication, however without relief.  He was instructed to go to the emergency room for further evaluation.  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  Patient was also noted to have acute kidney injury with creatinine of 1.43.  Lactic acid level was elevated at 2.25 on admission.  White blood cell within normal limit at 10.4.  He also complained of a severe headache with visual changes prior to the abdominal pain.  Blood culture was negative x2.  Urinalysis was negative as well.  Urine drug screen was only positive for opioids. He had negative sedimentation rate, normal ANA, negative anticardiolipin antibody, normal PTT lupus anticoagulant level.  HIV negative.  Genetic testing for factor V Leiden and thrombophilia was negative as well.  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  The underlying etiology of renal infarct was never elucidated.  Echocardiogram obtained on 06/30/2017 showed EF 55 to 60%, mildly dilated left atrium, bubble study did not show any atrial septal defect or patent foramen ovale.  Patient was referred to cardiology office on 08/23/2017 for further evaluation of recent renal  infarct by hematology service Dr. Irene Limbo.  He reported that he did have severe headache prior to the hospitalization that resolved.  He had completed the starting pack of Xarelto at 15 mg twice daily and has since transition to 20 mg daily of Xarelto.  He reported by his apple watch and has not noticed a significant arrhythmia recently.  He denied any palpitation, chest pain, exertional shortness of breath.  He denied any fever or chill prior to the hospitalization however did have some chills after he came home.  Due to elevation of blood pressure after the hospitalization, he was initially placed on 10 mg lisinopril-HCTZ, but this was later reduced to lisinopril 5 mg daily.  He was feeling well on that visit.  The PA discussed the case with Dr. Ellyn Hack.  Although patient had extensive hypercoagulation lab and genetic testing along with negative blood culture, potential differential diagnosis include embolic stroke as result of undiagnosed atrial fibrillation versus aseptic endocarditis such as Libmen-Sacks endocarditis which commonly have negative blood culture anyway.  To further assess these the patient was recommend to have transesophageal echocardiogram.  If TEE is negative for any embolic source, then a loop recorder would be reasonable for chronic monitoring of atrial fibrillation and to help guide the duration of systemic anticoagulation therapy.    History reviewed. No pertinent past medical history.   Surgical History:  Past Surgical History:  Procedure Laterality Date  . SHOULDER SURGERY       Medications Prior to Admission  Medication Sig Dispense Refill Last Dose  . lisinopril (PRINIVIL,ZESTRIL) 5 MG tablet Take 1 tablet (5 mg total) by mouth daily. [place]  tablet 3   . XARELTO 20 MG TABS tablet Take 20 mg by mouth daily with supper.    Taking    Inpatient Medications:   Allergies: No Known Allergies  Social History   Socioeconomic History  . Marital status: Married    Spouse name:  Not on file  . Number of children: Not on file  . Years of education: Not on file  . Highest education level: Not on file  Occupational History  . Not on file  Social Needs  . Financial resource strain: Not on file  . Food insecurity:    Worry: Not on file    Inability: Not on file  . Transportation needs:    Medical: Not on file    Non-medical: Not on file  Tobacco Use  . Smoking status: Never Smoker  . Smokeless tobacco: Never Used  Substance and Sexual Activity  . Alcohol use: Yes  . Drug use: Never  . Sexual activity: Not on file  Lifestyle  . Physical activity:    Days per week: Not on file    Minutes per session: Not on file  . Stress: Not on file  Relationships  . Social connections:    Talks on phone: Not on file    Gets together: Not on file    Attends religious service: Not on file    Active member of club or organization: Not on file    Attends meetings of clubs or organizations: Not on file    Relationship status: Not on file  . Intimate partner violence:    Fear of current or ex partner: Not on file    Emotionally abused: Not on file    Physically abused: Not on file    Forced sexual activity: Not on file  Other Topics Concern  . Not on file  Social History Narrative  . Not on file     Family History  Problem Relation Age of Onset  . Lupus Mother   . Lung cancer Mother   . Aortic stenosis Brother   . Arrhythmia Brother        possible VT  . Hypertension Father       Review of Systems: All other systems reviewed and are otherwise negative except as noted above.  Physical Exam: There were no vitals filed for this visit.  GEN- The patient is well appearing, alert and oriented x 3 today.   Head- normocephalic, atraumatic Eyes-  Sclera clear, conjunctiva pink Ears- hearing intact Oropharynx- clear Neck- supple Lungs- CTA b/l, normal work of breathing Heart- RRR, no murmurs, rubs or gallops  GI- soft, NT, ND Extremities- no clubbing,  cyanosis, or edema MS- no significant deformity or atrophy Skin- no rash or lesion Psych- euthymic mood, full affect   Labs:   Lab Results  Component Value Date   WBC 8.3 08/28/2017   HGB 13.9 08/28/2017   HCT 42.0 08/28/2017   MCV 90 08/28/2017   PLT 313 08/28/2017    Recent Labs  Lab 08/28/17 1522  NA 140  K 4.3  CL 100  CO2 23  BUN 14  CREATININE 1.23  CALCIUM 9.9  GLUCOSE 78   No results found for: CKTOTAL, CKMB, CKMBINDEX, TROPONINI No results found for: CHOL No results found for: HDL No results found for: LDLCALC No results found for: TRIG No results found for: CHOLHDL No results found for: LDLDIRECT  No results found for: DDIMER   Radiology/Studies: No results found.  12-lead ECG SR  All prior EKG's in EPIC reviewed with no documented atrial fibrillation  Telemetry CV strips in Epic reviewed, SR only  Assessment and Plan:  1. Systemic embolism The patient presents with hx of renal infacrt.  The patient has a TEE planned for this AM.  I spoke at length with the patient about monitoring for afib with either a 30 day event monitor or an implantable loop recorder.  Risks, benefits, and alteratives to implantable loop recorder were discussed with the patient today.   At this time, the patient is very clear in his decision to proceed with implantable loop recorder.   Wound care was reviewed with the patient (keep incision clean and dry for 3 days).  Wound check will be scheduled for the patient  Please call with questions.   Renee Dyane Dustman, PA-C 08/29/2017   I have seen, examined the patient, and reviewed the above assessment and plan.  Changes to above are made where necessary.  On exam, RRR.  I agree with Hematology that the source of his embolism remains known.  I think that further monitoring for atrial fibrillation is prudent at this time.  He has previously had telemetry monitoring which did not show afib.  TEE today is unrevealing. I would therefore  advise ILR implantation to evaluate for AF as the source for his embolism.  Risks of procedure discussed with patient and spouse who wish to proceed.  Co Sign: Thompson Grayer, MD 08/29/2017 11:49 AM

## 2017-08-29 NOTE — Progress Notes (Signed)
Normal lab, no contraindication with today's procedure.

## 2017-08-29 NOTE — Consult Note (Addendum)
ELECTROPHYSIOLOGY CONSULT NOTE  Patient ID: Glen Rollins MRN: [id number], DOB/AGE: [birth year]   Admit date: 08/29/2017 Date of Consult: 08/29/2017  Primary Physician: [name], MD Primary Cardiologist: Dr. Ellyn Hack Reason for Consultation: recommendations regarding Implantable Loop Recorder, requested by Dr. Ellyn Hack  History of Present Illness Glen Rollins in review of his cardiology consult note that was very thorough by H. [place], [place], he noted:  The patient has no significant past medical history who was initially seen at an [facility] on 06/28/2017 with epigastric abdominal pain. Symptom was felt to be related to peptic ulcer disease and he was prescribed Protonix, Carafate and Bentyl.  He took 1 dose of medication, however without relief.  He was instructed to go to the emergency room for further evaluation.  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  Patient was also noted to have acute kidney injury with creatinine of 1.43.  Lactic acid level was elevated at 2.25 on admission.  White blood cell within normal limit at 10.4.  He also complained of a severe headache with visual changes prior to the abdominal pain.  Blood culture was negative x2.  Urinalysis was negative as well.  Urine drug screen was only positive for opioids. He had negative sedimentation rate, normal ANA, negative anticardiolipin antibody, normal PTT lupus anticoagulant level.  HIV negative.  Genetic testing for factor V Leiden and thrombophilia was negative as well.  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  The underlying etiology of renal infarct was never elucidated.  Echocardiogram obtained on 06/30/2017 showed EF 55 to 60%, mildly dilated left atrium, bubble study did not show any atrial septal defect or patent foramen ovale.  Patient was referred to cardiology office on 08/23/2017 for further evaluation of recent renal  infarct by hematology service Dr. Irene Limbo.  He reported that he did have severe headache prior to the hospitalization that resolved.  He had completed the starting pack of Xarelto at 15 mg twice daily and has since transition to 20 mg daily of Xarelto.  He reported by his apple watch and has not noticed a significant arrhythmia recently.  He denied any palpitation, chest pain, exertional shortness of breath.  He denied any fever or chill prior to the hospitalization however did have some chills after he came home.  Due to elevation of blood pressure after the hospitalization, he was initially placed on 10 mg lisinopril-HCTZ, but this was later reduced to lisinopril 5 mg daily.  He was feeling well on that visit.  The PA discussed the case with Dr. Ellyn Hack.  Although patient had extensive hypercoagulation lab and genetic testing along with negative blood culture, potential differential diagnosis include embolic stroke as result of undiagnosed atrial fibrillation versus aseptic endocarditis such as Libmen-Sacks endocarditis which commonly have negative blood culture anyway.  To further assess these the patient was recommend to have transesophageal echocardiogram.  If TEE is negative for any embolic source, then a loop recorder would be reasonable for chronic monitoring of atrial fibrillation and to help guide the duration of systemic anticoagulation therapy.    History reviewed. No pertinent past medical history.   Surgical History:  Past Surgical History:  Procedure Laterality Date  . SHOULDER SURGERY       Medications Prior to Admission  Medication Sig Dispense Refill Last Dose  . lisinopril (PRINIVIL,ZESTRIL) 5 MG tablet Take 1 tablet (5 mg total) by mouth daily. [place]  tablet 3   . XARELTO 20 MG TABS tablet Take 20 mg by mouth daily with supper.    Taking    Inpatient Medications:   Allergies: No Known Allergies  Social History   Socioeconomic History  . Marital status: Married    Spouse name:  Not on file  . Number of children: Not on file  . Years of education: Not on file  . Highest education level: Not on file  Occupational History  . Not on file  Social Needs  . Financial resource strain: Not on file  . Food insecurity:    Worry: Not on file    Inability: Not on file  . Transportation needs:    Medical: Not on file    Non-medical: Not on file  Tobacco Use  . Smoking status: Never Smoker  . Smokeless tobacco: Never Used  Substance and Sexual Activity  . Alcohol use: Yes  . Drug use: Never  . Sexual activity: Not on file  Lifestyle  . Physical activity:    Days per week: Not on file    Minutes per session: Not on file  . Stress: Not on file  Relationships  . Social connections:    Talks on phone: Not on file    Gets together: Not on file    Attends religious service: Not on file    Active member of club or organization: Not on file    Attends meetings of clubs or organizations: Not on file    Relationship status: Not on file  . Intimate partner violence:    Fear of current or ex partner: Not on file    Emotionally abused: Not on file    Physically abused: Not on file    Forced sexual activity: Not on file  Other Topics Concern  . Not on file  Social History Narrative  . Not on file     Family History  Problem Relation Age of Onset  . Lupus Mother   . Lung cancer Mother   . Aortic stenosis Brother   . Arrhythmia Brother        possible VT  . Hypertension Father       Review of Systems: All other systems reviewed and are otherwise negative except as noted above.  Physical Exam: There were no vitals filed for this visit.  GEN- The patient is well appearing, alert and oriented x 3 today.   Head- normocephalic, atraumatic Eyes-  Sclera clear, conjunctiva pink Ears- hearing intact Oropharynx- clear Neck- supple Lungs- CTA b/l, normal work of breathing Heart- RRR, no murmurs, rubs or gallops  GI- soft, NT, ND Extremities- no clubbing,  cyanosis, or edema MS- no significant deformity or atrophy Skin- no rash or lesion Psych- euthymic mood, full affect   Labs:   Lab Results  Component Value Date   WBC 8.3 08/28/2017   HGB 13.9 08/28/2017   HCT 42.0 08/28/2017   MCV 90 08/28/2017   PLT 313 08/28/2017    Recent Labs  Lab 08/28/17 1522  NA 140  K 4.3  CL 100  CO2 23  BUN 14  CREATININE 1.23  CALCIUM 9.9  GLUCOSE 78   No results found for: CKTOTAL, CKMB, CKMBINDEX, TROPONINI No results found for: CHOL No results found for: HDL No results found for: LDLCALC No results found for: TRIG No results found for: CHOLHDL No results found for: LDLDIRECT  No results found for: DDIMER   Radiology/Studies: No results found.  12-lead ECG SR  All prior EKG's in EPIC reviewed with no documented atrial fibrillation  Telemetry CV strips in Epic reviewed, SR only  Assessment and Plan:  1. Systemic embolism The patient presents with hx of renal infacrt.  The patient has a TEE planned for this AM.  I spoke at length with the patient about monitoring for afib with either a 30 day event monitor or an implantable loop recorder.  Risks, benefits, and alteratives to implantable loop recorder were discussed with the patient today.   At this time, the patient is very clear in his decision to proceed with implantable loop recorder.   Wound care was reviewed with the patient (keep incision clean and dry for 3 days).  Wound check will be scheduled for the patient  Please call with questions.   Renee Dyane Dustman, PA-C 08/29/2017   I have seen, examined the patient, and reviewed the above assessment and plan.  Changes to above are made where necessary.  On exam, RRR.  I agree with Hematology that the source of his embolism remains known.  I think that further monitoring for atrial fibrillation is prudent at this time.  He has previously had telemetry monitoring which did not show afib.  TEE today is unrevealing. I would therefore  advise ILR implantation to evaluate for AF as the source for his embolism.  Risks of procedure discussed with patient and spouse who wish to proceed.  Co Sign: Thompson Grayer, MD 08/29/2017 11:49 AM

## 2017-09-06 ENCOUNTER — Ambulatory Visit (INDEPENDENT_AMBULATORY_CARE_PROVIDER_SITE_OTHER): Payer: 59 | Admitting: *Deleted

## 2017-09-06 DIAGNOSIS — N280 Ischemia and infarction of kidney: Secondary | ICD-10-CM

## 2017-09-06 LAB — CUP PACEART INCLINIC DEVICE CHECK
Date Time Interrogation Session: 20190815162733
Implantable Pulse Generator Implant Date: 20190807

## 2017-09-06 NOTE — Progress Notes (Signed)
Wound check appointment. Steri-strips removed by patient prior to appointment. Wound without redness or edema. Incision edges approximated, wound well healed. Normal device function. Battery status: good. R-waves 0.77mV. No symptom, tachy, or AF episodes. Pause and brady detection off at implant. Patient educated about wound care and Carelink monitor. Monthly summary reports and ROV with JA PRN.

## 2017-09-07 ENCOUNTER — Encounter: Payer: Self-pay | Admitting: Hematology

## 2017-09-10 ENCOUNTER — Ambulatory Visit: Payer: 59

## 2017-09-19 DIAGNOSIS — D225 Melanocytic nevi of trunk: Secondary | ICD-10-CM | POA: Diagnosis not present

## 2017-09-19 DIAGNOSIS — D224 Melanocytic nevi of scalp and neck: Secondary | ICD-10-CM | POA: Diagnosis not present

## 2017-10-01 ENCOUNTER — Ambulatory Visit (INDEPENDENT_AMBULATORY_CARE_PROVIDER_SITE_OTHER): Payer: 59 | Admitting: *Deleted

## 2017-10-01 DIAGNOSIS — N280 Ischemia and infarction of kidney: Secondary | ICD-10-CM

## 2017-10-02 NOTE — Progress Notes (Signed)
Carelink Summary Report / Loop Recorder 

## 2017-10-19 LAB — CUP PACEART REMOTE DEVICE CHECK
Date Time Interrogation Session: 20190909174022
MDC IDC PG IMPLANT DT: 20190807

## 2017-11-05 ENCOUNTER — Ambulatory Visit (INDEPENDENT_AMBULATORY_CARE_PROVIDER_SITE_OTHER): Payer: 59 | Admitting: *Deleted

## 2017-11-05 DIAGNOSIS — I639 Cerebral infarction, unspecified: Secondary | ICD-10-CM

## 2017-11-05 DIAGNOSIS — N280 Ischemia and infarction of kidney: Secondary | ICD-10-CM | POA: Diagnosis not present

## 2017-11-05 NOTE — Progress Notes (Signed)
Carelink Summary Report / Loop Recorder 

## 2017-11-08 DIAGNOSIS — I10 Essential (primary) hypertension: Secondary | ICD-10-CM | POA: Diagnosis not present

## 2017-11-08 DIAGNOSIS — N280 Ischemia and infarction of kidney: Secondary | ICD-10-CM | POA: Diagnosis not present

## 2017-11-08 DIAGNOSIS — Z23 Encounter for immunization: Secondary | ICD-10-CM | POA: Diagnosis not present

## 2017-11-19 LAB — CUP PACEART REMOTE DEVICE CHECK
MDC IDC PG IMPLANT DT: 20190807
MDC IDC SESS DTM: 20191012173838

## 2017-11-27 ENCOUNTER — Telehealth: Payer: Self-pay

## 2017-11-27 NOTE — Telephone Encounter (Signed)
Pt called and LVM regarding need to reschedule appt on 12/04/17. Called pt back to let him know that Dr. Irene Limbo is out of the office this week and next. Pt should expect a call from scheduling dept to reschedule. Pt encouraged to call back Friday morning if he has not heard anything yet. Pt verbalized understanding and thanks for the update.

## 2017-11-29 ENCOUNTER — Telehealth: Payer: Self-pay | Admitting: Hematology

## 2017-11-29 NOTE — Telephone Encounter (Signed)
GK out 11/12 - per [place] move appointments three weeks out. Spoke with patient re lab/fu 12/10.

## 2017-12-04 ENCOUNTER — Ambulatory Visit: Payer: 59 | Admitting: Hematology

## 2017-12-04 ENCOUNTER — Other Ambulatory Visit: Payer: 59

## 2017-12-06 ENCOUNTER — Ambulatory Visit (INDEPENDENT_AMBULATORY_CARE_PROVIDER_SITE_OTHER): Payer: 59 | Admitting: *Deleted

## 2017-12-06 DIAGNOSIS — I639 Cerebral infarction, unspecified: Secondary | ICD-10-CM | POA: Diagnosis not present

## 2017-12-06 NOTE — Progress Notes (Signed)
Carelink Summary Report / Loop Recorder 

## 2017-12-17 ENCOUNTER — Telehealth: Payer: Self-pay | Admitting: Internal Medicine

## 2017-12-17 NOTE — Telephone Encounter (Signed)
  Error, No note needed

## 2017-12-26 LAB — CUP PACEART REMOTE DEVICE CHECK
Implantable Pulse Generator Implant Date: 20190807
MDC IDC SESS DTM: 20191114183937

## 2018-01-01 ENCOUNTER — Other Ambulatory Visit: Payer: Self-pay | Admitting: Hematology

## 2018-01-01 ENCOUNTER — Inpatient Hospital Stay (HOSPITAL_BASED_OUTPATIENT_CLINIC_OR_DEPARTMENT_OTHER): Payer: 59 | Admitting: Hematology

## 2018-01-01 ENCOUNTER — Inpatient Hospital Stay: Payer: 59 | Attending: Hematology

## 2018-01-01 VITALS — BP 124/83 | HR 66 | Temp 98.1°F | Resp 18 | Ht 70.0 in | Wt 198.0 lb

## 2018-01-01 DIAGNOSIS — Z79899 Other long term (current) drug therapy: Secondary | ICD-10-CM | POA: Insufficient documentation

## 2018-01-01 DIAGNOSIS — N280 Ischemia and infarction of kidney: Secondary | ICD-10-CM

## 2018-01-01 DIAGNOSIS — D6861 Antiphospholipid syndrome: Secondary | ICD-10-CM | POA: Diagnosis not present

## 2018-01-01 DIAGNOSIS — Z7901 Long term (current) use of anticoagulants: Secondary | ICD-10-CM | POA: Diagnosis not present

## 2018-01-01 DIAGNOSIS — D6859 Other primary thrombophilia: Secondary | ICD-10-CM

## 2018-01-01 LAB — CBC WITH DIFFERENTIAL/PLATELET
Abs Immature Granulocytes: 0.01 10*3/uL (ref 0.00–0.07)
Basophils Absolute: 0 10*3/uL (ref 0.0–0.1)
Basophils Relative: 1 %
Eosinophils Absolute: 0.1 10*3/uL (ref 0.0–0.5)
Eosinophils Relative: 2 %
HCT: 44.8 % (ref 39.0–52.0)
Hemoglobin: 14.7 g/dL (ref 13.0–17.0)
Immature Granulocytes: 0 %
LYMPHS PCT: 35 %
Lymphs Abs: 2 10*3/uL (ref 0.7–4.0)
MCH: 29.8 pg (ref 26.0–34.0)
MCHC: 32.8 g/dL (ref 30.0–36.0)
MCV: 90.9 fL (ref 80.0–100.0)
Monocytes Absolute: 0.5 10*3/uL (ref 0.1–1.0)
Monocytes Relative: 9 %
Neutro Abs: 3 10*3/uL (ref 1.7–7.7)
Neutrophils Relative %: 53 %
Platelets: 247 10*3/uL (ref 150–400)
RBC: 4.93 MIL/uL (ref 4.22–5.81)
RDW: 12.7 % (ref 11.5–15.5)
WBC: 5.7 10*3/uL (ref 4.0–10.5)
nRBC: 0 % (ref 0.0–0.2)

## 2018-01-01 LAB — CMP (CANCER CENTER ONLY)
ALT: 12 U/L (ref 0–44)
AST: 18 U/L (ref 15–41)
Albumin: 4.4 g/dL (ref 3.5–5.0)
Alkaline Phosphatase: 75 U/L (ref 38–126)
Anion gap: 7 (ref 5–15)
BUN: 15 mg/dL (ref 6–20)
CHLORIDE: 105 mmol/L (ref 98–111)
CO2: 27 mmol/L (ref 22–32)
Calcium: 9.9 mg/dL (ref 8.9–10.3)
Creatinine: 1.26 mg/dL — ABNORMAL HIGH (ref 0.61–1.24)
GFR, Est AFR Am: >60 mL/min (ref >60)
Glucose, Bld: 112 mg/dL — ABNORMAL HIGH (ref 70–99)
Potassium: 4.5 mmol/L (ref 3.5–5.1)
Sodium: 139 mmol/L (ref 135–145)
Total Bilirubin: 1.3 mg/dL — ABNORMAL HIGH (ref 0.3–1.2)
Total Protein: 7.6 g/dL (ref 6.5–8.1)

## 2018-01-01 NOTE — Progress Notes (Signed)
HEMATOLOGY/ONCOLOGY CLINIC NOTE  Date of Service: 01/01/2018  Patient Care Team: [name], MD as PCP - General (Family Medicine)  CHIEF COMPLAINTS/PURPOSE OF CONSULTATION:  Acute embolic renal infarction   HISTORY OF PRESENTING ILLNESS:   Glen Rollins is a wonderful 47 y.o. male who has been referred to [place] by Dr. [name] for evaluation and management of Acute embolic renal infarction. The pt reports that he is doing well overall.   The pt presented to the ED on 06/28/17 with severe abdominal pain and was discovered to have a left renal infarct with mild kidney injury. The pt was admitted with heparin and discharged with a Xarelto starter kit. He notes that his upper abdominal pain radiated to his back. The pt is continuing to take Xarelto and is newly taking Xarelto as well, without history of high blood pressure.   The pt notes that prior to the event, for a few weeks he felt more significant fatigue. He denies any unexpected weight loss, fevers, chills, and night sweats. He does note some recent, sporadic episodes of sweating and then being cold.   The pt notes that he did not have heart palpitations, chest pain or SOB in the setting of his clot, nor historically. He also denies a history of dry mouth, mouth sores, red/swollen/painful joints, and red eyes.   The pt notes that in the weeks leading up to his clot, he had leg cramps at night in his lower legs and denies this occurring while walking. He also had been having bad headaches, including the worst headache he had ever had 4-5 days before his ED trip. He denies neck pain and remembers the top of his head hurting, denies changes in speech or dizziness. He felt like he possibly had some blurry vision at the time. After lying down for a few hours his headache subsided and he resumed his normal activities.   He notes that he has seen a dermatologist regarding a recurring chest rash, tinea versicolor, for which he  takes Itroconazole.   Of note prior to the patient's visit today, pt has had CT A/P completed on 06/28/17 with results revealing Acute infarct involving approximately half of the left renal parenchyma, with visible occlusion of 2 of the branches of the left renal artery on coronal images. This is concerning for an embolic event from an unknown source. Would correlate for underlying coagulopathic condition. Echocardiography could be considered to exclude underlying thrombus, though the heart is grossly unremarkable in appearance on CT. 2. No definite evidence for fibromuscular dysplasia or renal artery dissection. The renal arteries are otherwise unremarkable in appearance.   Most recent lab results (07/05/17) of CBC w/diff is as follows: all values are WNL except for PLT at 434k, Mono abs at 1.4k.  On review of systems, pt reports resolving fatigue, and denies chest pain, palpitations, mouth sores, dry mouth, red/swollen/painful joints, SOB, fevers, chills, night sweats, unexpected weight loss, noticing any new lumps or bumps, abdominal pains, leg swelling, and any other symptoms.   On PMHx the pt reports tinea versicolor, left wrist fx as a child, left shoulder surgery in 2001, denies abdominal trauma, back trauma, and high blood pressure. On Social Hx the pt reports social ETOH consumption, denies ever smoking cigarettes or drug use. 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  On Family Hx the pt reports father with high blood pressure, mother with lupus, mom had mini-strokes, mom died from lung cancer and was  a life-long smoker, and denies any blood disorders. Brother at 56 y/o with Afib.  Interval History:  Glen Rollins returns today for management and evaluation of his history of an acute embolic renal infarction. The patient's last visit with us was on 08/07/17. The pt reports that he is doing well overall.   The pt reports that he has been following up with  cardiology and that a cardiac risk factor for his prior renal infarction has not been elucidated. He has been wearing an event recorder since August without any abnormalities detected.   The pt has not had any medication changes. He denies any difficulty taking Xarelto and denies any problems with bleeding. The pt denies any blood in the stools or black stools. The pt also denies any unexpected weight loss, flank pain, or leg swelling.   He notes that he has continued on Lisinopril. He also notes that he has felt much better overall since his last visit and denies any abnormal headaches.   Lab results today (01/01/18) of CBC w/diff and CMP is as follows: all values are WNL except for Glucose at 112, Creatinine at 1.26, Total Bilirubin at 1.3.  On review of systems, pt reports good energy levels, moving his bowels well, and denies blood in the stools, black stools, concerns bleeding, abnormal headaches, unexpected weight loss, flank pain, abdominal pains, lower abdominal pains, leg swelling, and any other symptoms.   MEDICAL HISTORY:  No past medical history on file.  SURGICAL HISTORY: Past Surgical History:  Procedure Laterality Date  . LOOP RECORDER INSERTION N/A 08/29/2017   Procedure: LOOP RECORDER INSERTION;  Surgeon: Allred, Boby, MD;  Location: [facility];  Service: Cardiovascular;  Laterality: N/A;  . SHOULDER SURGERY    . TEE WITHOUT CARDIOVERSION N/A 08/29/2017   Procedure: TRANSESOPHAGEAL ECHOCARDIOGRAM (TEE) Bubble Study;  Surgeon: Crenshaw, Brian S, MD;  Location: [facility] ENDOSCOPY;  Service: Cardiovascular;  Laterality: N/A;    SOCIAL HISTORY: Social History   Socioeconomic History  . Marital status: Married    Spouse name: Not on file  . Number of children: Not on file  . Years of education: Not on file  . Highest education level: Not on file  Occupational History  . Not on file  Social Needs  . Financial resource strain: Not on file  . Food insecurity:    Worry:  Not on file    Inability: Not on file  . Transportation needs:    Medical: Not on file    Non-medical: Not on file  Tobacco Use  . Smoking status: Never Smoker  . Smokeless tobacco: Never Used  Substance and Sexual Activity  . Alcohol use: Yes  . Drug use: Never  . Sexual activity: Not on file  Lifestyle  . Physical activity:    Days per week: Not on file    Minutes per session: Not on file  . Stress: Not on file  Relationships  . Social connections:    Talks on phone: Not on file    Gets together: Not on file    Attends religious service: Not on file    Active member of club or organization: Not on file    Attends meetings of clubs or organizations: Not on file    Relationship status: Not on file  . Intimate partner violence:    Fear of current or ex partner: Not on file    Emotionally abused: Not on file    Physically abused: Not on file      Forced sexual activity: Not on file  Other Topics Concern  . Not on file  Social History Narrative  . Not on file    FAMILY HISTORY: Family History  Problem Relation Age of Onset  . Lupus Mother   . Lung cancer Mother   . Aortic stenosis Brother   . Arrhythmia Brother        possible VT  . Hypertension Father     ALLERGIES:  has No Known Allergies.  MEDICATIONS:  Current Outpatient Medications  Medication Sig Dispense Refill  . lisinopril (PRINIVIL,ZESTRIL) 5 MG tablet Take 1 tablet (5 mg total) by mouth daily. 90 tablet 3  . XARELTO 20 MG TABS tablet Take 20 mg by mouth daily with supper.      No current facility-administered medications for this visit.     REVIEW OF SYSTEMS:    A 10+ POINT REVIEW OF SYSTEMS WAS OBTAINED including neurology, dermatology, psychiatry, cardiac, respiratory, lymph, extremities, GI, GU, Musculoskeletal, constitutional, breasts, reproductive, HEENT.  All pertinent positives are noted in the HPI.  All others are negative.   PHYSICAL EXAMINATION:  . Vitals:   01/01/18 1345  BP: 124/83    Pulse: 66  Resp: 18  Temp: 98.1 F (36.7 C)  SpO2: 100%   Filed Weights   01/01/18 1345  Weight: 198 lb (89.8 kg)   .Body mass index is 28.41 kg/m.  GENERAL:alert, in no acute distress and comfortable SKIN: no acute rashes, no significant lesions EYES: conjunctiva are pink and non-injected, sclera anicteric OROPHARYNX: MMM, no exudates, no oropharyngeal erythema or ulceration NECK: supple, no JVD LYMPH:  no palpable lymphadenopathy in the cervical, axillary or inguinal regions LUNGS: clear to auscultation b/l with normal respiratory effort HEART: regular rate & rhythm ABDOMEN:  normoactive bowel sounds , non tender, not distended. No palpable hepatosplenomegaly.  Extremity: no pedal edema PSYCH: alert & oriented x 3 with fluent speech NEURO: no focal motor/sensory deficits   LABORATORY DATA:  I have reviewed the data as listed  . CBC Latest Ref Rng & Units 01/01/2018 08/28/2017 06/30/2017  WBC 4.0 - 10.5 K/uL 5.7 8.3 14.2(H)  Hemoglobin 13.0 - 17.0 g/dL 14.7 13.9 12.4(L)  Hematocrit 39.0 - 52.0 % 44.8 42.0 37.6(L)  Platelets 150 - 400 K/uL 247 313 203    . CMP Latest Ref Rng & Units 01/01/2018 08/28/2017 06/30/2017  Glucose 70 - 99 mg/dL 112(H) 78 116(H)  BUN 6 - 20 mg/dL _0 Creatinine 0.61 - 1.24 mg/dL 1.26(H) 1.23 1.25(H)  Sodium 135 - 145 mmol/L 139 140 136  Potassium 3.5 - 5.1 mmol/L 4.5 4.3 3.5  Chloride 98 - 111 mmol/L 105 100 105  CO2 22 - 32 mmol/L _1 Calcium 8.9 - 10.3 mg/dL 9.9 9.9 8.8(L)  Total Protein 6.5 - 8.1 g/dL 7.6 - -  Total Bilirubin 0.3 - 1.2 mg/dL 1.3(H) - -  Alkaline Phos 38 - 126 U/L 75 - -  AST 15 - 41 U/L 18 - -  ALT 0 - 44 U/L 12 - -   08/07/17 Molecular Pathology:    07/05/17 CBC w/diff:    Component     Latest Ref Rng & Units 08/07/2017  IgG (Immunoglobin G), Serum     700 - 1,600 mg/dL 962  IgA     90 - 386 mg/dL 134  IgM (Immunoglobulin M), Srm     20 - 172 mg/dL 121  Total Protein ELP     6.0 - 8.5 g/dL  7.0   Albumin SerPl Elph-Mcnc     2.9 - 4.4 g/dL 3.9  Alpha 1     0.0 - 0.4 g/dL 0.3  Alpha2 Glob SerPl Elph-Mcnc     0.4 - 1.0 g/dL 0.8  B-Globulin SerPl Elph-Mcnc     0.7 - 1.3 g/dL 0.9  Gamma Glob SerPl Elph-Mcnc     0.4 - 1.8 g/dL 1.1  M Protein SerPl Elph-Mcnc     Not Observed g/dL Not Observed  Globulin, Total     2.2 - 3.9 g/dL 3.1  Albumin/Glob SerPl     0.7 - 1.7 1.3  IFE 1      Comment  Please Note (HCV):      Comment  Cytoplasmic (C-ANCA)     Neg:<1:20 titer <1:20  P-ANCA     Neg:<1:20 titer <1:20  Atypical P-ANCA titer     Neg:<1:20 titer <1:20  Beta-2 Glycoprotein I Ab, IgG     0 - 20 GPI IgG units <9  Beta-2-Glycoprotein I IgM     0 - 32 GPI IgM units <9  Beta-2-Glycoprotein I IgA     0 - 25 GPI IgA units <9  Sed Rate     0 - 16 mm/hr 10  ANA Ab, IFA      Negative  Antithrombin Activity     75 - 120 % 101  Homocysteine     0.0 - 15.0 umol/L 11.7   Factor 5 leiden  Order: [id number]  Status:  Final result Visible to patient:  No (Not Released) Next appt:  12/04/2017 at 10:00 AM in Oncology ([facility]) Dx:  Renal infarction (HCC)  Component 7d ago  Recommendations-F5LEID: Comment   Comment: (NOTE)  Result: Negative (no mutation found)        Prothrombin gene mutation  Order: [id number]  Status:  Final result Visible to patient:  No (Not Released) Next appt:  12/04/2017 at 10:00 AM in Oncology ([facility]) Dx:  Renal infarction (HCC)  Component 7d ago  Recommendations-PTGENE: Comment   Comment: (NOTE)  NEGATIVE  No mutation identified.          RADIOGRAPHIC STUDIES: I have personally reviewed the radiological images as listed and agreed with the findings in the report. No results found.  ASSESSMENT & PLAN:   47 y.o. male with  1. Acute embolic renal infarction Labs upon initial presentation from 07/05/17, PLT at 434k, HGB normal at 13.8, WBC normal at 8.5k. No polycythemia.  Antiphospholipid syndrome eval 06/29/17 was  normal  06/28/17 CT A/P revealed Acute infarct involving approximately half of the left renal parenchyma, with visible occlusion of 2 of the branches of the left renal artery on coronal images. This is concerning for an embolic event from an unknown source.   06/30/17 ECHO did not reveal defects or patent foramen ovale and no right-to-left atrial level shunt.    PLAN:  -Discussed pt labwork today, 01/01/18; blood counts all normal, chemistries are stable  -Discussed that the patient's JAK2 mutation was negative  -ANCA titers were negative, Sed Rate was normal, ANA antibody was negative, SPEP was normal, no clotting protein dysfunctions, no Prothrombin gene mutation, no Factor V leiden mutation  -Anticardiolipin antibodies were negative. HIV was negative -No hypercoagulable state has been identified on work up -No polycythemia  -Patient has followed up with Cardiology since August and currently has an loop recorder implant which has not detected abnormalities yet -Discussed that as the patient has completed 6 months of 20mg   Xarelto use, the pt could choose to discontinue Xarelto and begin one year of 81mg aspirin otc -The pt will discontinue 20mg Xarelto and will begin 81mg aspirin for one year -Will be happy to see the pt back as needed     RTC with Dr Kale as needed   All of the patients questions were answered with apparent satisfaction. The patient knows to call the clinic with any problems, questions or concerns.  The total time spent in the appt was 30 minutes and more than 50% was on counseling and direct patient cares.    Gautam Kale MD MS AAHIVMS [facility] C[facility] Hematology/Oncology Physician [facility]  (Office):       [phone] (Work cell):  [phone] (Fax):           [phone]  01/01/2018 2:21 PM  I, Schuyler Bain, am acting as a scribe for Dr. Gautam Kale.   .I have reviewed the above documentation for accuracy and completeness, and I agree with the  above. .Gautam Kishore Kale MD   

## 2018-01-08 ENCOUNTER — Ambulatory Visit (INDEPENDENT_AMBULATORY_CARE_PROVIDER_SITE_OTHER): Payer: 59

## 2018-01-08 DIAGNOSIS — I639 Cerebral infarction, unspecified: Secondary | ICD-10-CM | POA: Diagnosis not present

## 2018-01-09 NOTE — Progress Notes (Signed)
Carelink Summary Report / Loop Recorder 

## 2018-02-02 LAB — CUP PACEART REMOTE DEVICE CHECK
Date Time Interrogation Session: 20191217191018
Implantable Pulse Generator Implant Date: 20190807

## 2018-02-11 ENCOUNTER — Ambulatory Visit (INDEPENDENT_AMBULATORY_CARE_PROVIDER_SITE_OTHER): Payer: 59

## 2018-02-11 DIAGNOSIS — I639 Cerebral infarction, unspecified: Secondary | ICD-10-CM

## 2018-02-12 LAB — CUP PACEART REMOTE DEVICE CHECK
Date Time Interrogation Session: 20200119193944
Implantable Pulse Generator Implant Date: 20190807

## 2018-02-12 NOTE — Progress Notes (Signed)
Carelink Summary Report / Loop Recorder 

## 2018-03-12 DIAGNOSIS — R6882 Decreased libido: Secondary | ICD-10-CM | POA: Diagnosis not present

## 2018-03-12 DIAGNOSIS — N280 Ischemia and infarction of kidney: Secondary | ICD-10-CM | POA: Diagnosis not present

## 2018-03-12 DIAGNOSIS — I10 Essential (primary) hypertension: Secondary | ICD-10-CM | POA: Diagnosis not present

## 2018-03-15 ENCOUNTER — Ambulatory Visit (INDEPENDENT_AMBULATORY_CARE_PROVIDER_SITE_OTHER): Payer: 59

## 2018-03-15 DIAGNOSIS — I639 Cerebral infarction, unspecified: Secondary | ICD-10-CM | POA: Diagnosis not present

## 2018-03-16 LAB — CUP PACEART REMOTE DEVICE CHECK
Date Time Interrogation Session: 20200221193956
Implantable Pulse Generator Implant Date: 20190807

## 2018-03-21 NOTE — Progress Notes (Signed)
Carelink Summary Report / Loop Recorder 

## 2018-04-06 DIAGNOSIS — S42032A Displaced fracture of lateral end of left clavicle, initial encounter for closed fracture: Secondary | ICD-10-CM | POA: Diagnosis not present

## 2018-04-10 DIAGNOSIS — M25512 Pain in left shoulder: Secondary | ICD-10-CM | POA: Diagnosis not present

## 2018-04-10 DIAGNOSIS — S42035A Nondisplaced fracture of lateral end of left clavicle, initial encounter for closed fracture: Secondary | ICD-10-CM | POA: Diagnosis not present

## 2018-04-17 ENCOUNTER — Ambulatory Visit (INDEPENDENT_AMBULATORY_CARE_PROVIDER_SITE_OTHER): Payer: 59 | Admitting: *Deleted

## 2018-04-17 ENCOUNTER — Other Ambulatory Visit: Payer: Self-pay

## 2018-04-17 DIAGNOSIS — I639 Cerebral infarction, unspecified: Secondary | ICD-10-CM | POA: Diagnosis not present

## 2018-04-18 LAB — CUP PACEART REMOTE DEVICE CHECK
Date Time Interrogation Session: 20200325224037
Implantable Pulse Generator Implant Date: 20190807

## 2018-04-22 NOTE — Progress Notes (Signed)
Carelink Summary Report / Loop Recorder 

## 2018-04-24 DIAGNOSIS — M25512 Pain in left shoulder: Secondary | ICD-10-CM | POA: Diagnosis not present

## 2018-04-30 ENCOUNTER — Other Ambulatory Visit: Payer: Self-pay

## 2018-04-30 MED ORDER — LISINOPRIL 5 MG PO TABS: 5.0000 mg | ORAL_TABLET | Freq: Every day | ORAL | 3 refills | Status: DC

## 2018-05-20 ENCOUNTER — Ambulatory Visit (INDEPENDENT_AMBULATORY_CARE_PROVIDER_SITE_OTHER): Payer: 59 | Admitting: *Deleted

## 2018-05-20 ENCOUNTER — Other Ambulatory Visit: Payer: Self-pay

## 2018-05-20 DIAGNOSIS — I639 Cerebral infarction, unspecified: Secondary | ICD-10-CM | POA: Diagnosis not present

## 2018-05-21 ENCOUNTER — Encounter: Payer: Self-pay | Admitting: Hematology

## 2018-05-21 LAB — CUP PACEART REMOTE DEVICE CHECK
Date Time Interrogation Session: 20200427234118
Implantable Pulse Generator Implant Date: 20190807

## 2018-05-22 DIAGNOSIS — M25512 Pain in left shoulder: Secondary | ICD-10-CM | POA: Diagnosis not present

## 2018-05-27 NOTE — Progress Notes (Signed)
Carelink Summary Report / Loop Recorder 

## 2018-06-24 ENCOUNTER — Ambulatory Visit (INDEPENDENT_AMBULATORY_CARE_PROVIDER_SITE_OTHER): Payer: 59 | Admitting: *Deleted

## 2018-06-24 DIAGNOSIS — I639 Cerebral infarction, unspecified: Secondary | ICD-10-CM

## 2018-06-24 LAB — CUP PACEART REMOTE DEVICE CHECK
Date Time Interrogation Session: 20200531000716
Implantable Pulse Generator Implant Date: 20190807

## 2018-07-01 NOTE — Progress Notes (Signed)
Carelink Summary Report / Loop Recorder 

## 2018-07-25 ENCOUNTER — Ambulatory Visit (INDEPENDENT_AMBULATORY_CARE_PROVIDER_SITE_OTHER): Payer: 59 | Admitting: *Deleted

## 2018-07-25 DIAGNOSIS — I639 Cerebral infarction, unspecified: Secondary | ICD-10-CM | POA: Diagnosis not present

## 2018-07-26 LAB — CUP PACEART REMOTE DEVICE CHECK
Date Time Interrogation Session: 20200703004021
Implantable Pulse Generator Implant Date: 20190807

## 2018-08-01 NOTE — Progress Notes (Signed)
Carelink Summary Report / Loop Recorder 

## 2018-08-27 ENCOUNTER — Ambulatory Visit (INDEPENDENT_AMBULATORY_CARE_PROVIDER_SITE_OTHER): Payer: 59 | Admitting: *Deleted

## 2018-08-27 DIAGNOSIS — I639 Cerebral infarction, unspecified: Secondary | ICD-10-CM

## 2018-08-28 LAB — CUP PACEART REMOTE DEVICE CHECK
Date Time Interrogation Session: 20200805013627
Implantable Pulse Generator Implant Date: 20190807

## 2018-09-03 NOTE — Progress Notes (Signed)
Carelink Summary Report / Loop Recorder 

## 2018-10-01 ENCOUNTER — Ambulatory Visit (INDEPENDENT_AMBULATORY_CARE_PROVIDER_SITE_OTHER): Payer: 59 | Admitting: *Deleted

## 2018-10-01 DIAGNOSIS — I639 Cerebral infarction, unspecified: Secondary | ICD-10-CM

## 2018-10-01 LAB — CUP PACEART REMOTE DEVICE CHECK
Date Time Interrogation Session: 20200907013743
Implantable Pulse Generator Implant Date: 20190807

## 2018-10-16 NOTE — Progress Notes (Signed)
Carelink Summary Report / Loop Recorder 

## 2018-11-01 ENCOUNTER — Ambulatory Visit (INDEPENDENT_AMBULATORY_CARE_PROVIDER_SITE_OTHER): Payer: 59 | Admitting: *Deleted

## 2018-11-01 DIAGNOSIS — I639 Cerebral infarction, unspecified: Secondary | ICD-10-CM

## 2018-11-02 LAB — CUP PACEART REMOTE DEVICE CHECK
Date Time Interrogation Session: 20201010013815
Implantable Pulse Generator Implant Date: 20190807

## 2018-11-12 NOTE — Progress Notes (Signed)
Carelink Summary Report / Loop Recorder 

## 2018-12-04 ENCOUNTER — Ambulatory Visit (INDEPENDENT_AMBULATORY_CARE_PROVIDER_SITE_OTHER): Payer: 59 | Admitting: *Deleted

## 2018-12-04 DIAGNOSIS — I639 Cerebral infarction, unspecified: Secondary | ICD-10-CM

## 2018-12-05 LAB — CUP PACEART REMOTE DEVICE CHECK
Date Time Interrogation Session: 20201112014153
Implantable Pulse Generator Implant Date: 20190807

## 2018-12-26 NOTE — Progress Notes (Signed)
Carelink Summary Report / Loop Recorder 

## 2019-01-06 ENCOUNTER — Ambulatory Visit (INDEPENDENT_AMBULATORY_CARE_PROVIDER_SITE_OTHER): Payer: 59 | Admitting: *Deleted

## 2019-01-06 DIAGNOSIS — N280 Ischemia and infarction of kidney: Secondary | ICD-10-CM

## 2019-01-07 LAB — CUP PACEART REMOTE DEVICE CHECK
Date Time Interrogation Session: 20201215140754
Implantable Pulse Generator Implant Date: 20190807

## 2019-02-10 ENCOUNTER — Ambulatory Visit (INDEPENDENT_AMBULATORY_CARE_PROVIDER_SITE_OTHER): Payer: 59 | Admitting: *Deleted

## 2019-02-10 DIAGNOSIS — I639 Cerebral infarction, unspecified: Secondary | ICD-10-CM

## 2019-02-10 LAB — CUP PACEART REMOTE DEVICE CHECK
Date Time Interrogation Session: 20210117181709
Implantable Pulse Generator Implant Date: 20190807

## 2019-03-13 ENCOUNTER — Ambulatory Visit (INDEPENDENT_AMBULATORY_CARE_PROVIDER_SITE_OTHER): Payer: 59 | Admitting: *Deleted

## 2019-03-13 DIAGNOSIS — I639 Cerebral infarction, unspecified: Secondary | ICD-10-CM | POA: Diagnosis not present

## 2019-03-13 LAB — CUP PACEART REMOTE DEVICE CHECK
Date Time Interrogation Session: 20210217233903
Implantable Pulse Generator Implant Date: 20190807

## 2019-03-13 NOTE — Progress Notes (Signed)
ILR Remote 

## 2019-04-13 LAB — CUP PACEART REMOTE DEVICE CHECK
Date Time Interrogation Session: 20210321004019
Implantable Pulse Generator Implant Date: 20190807

## 2019-04-14 ENCOUNTER — Ambulatory Visit (INDEPENDENT_AMBULATORY_CARE_PROVIDER_SITE_OTHER): Payer: 59 | Admitting: *Deleted

## 2019-04-14 DIAGNOSIS — I639 Cerebral infarction, unspecified: Secondary | ICD-10-CM

## 2019-04-14 LAB — CUP PACEART REMOTE DEVICE CHECK
Date Time Interrogation Session: 20210321235628
Implantable Pulse Generator Implant Date: 20190807

## 2019-04-14 NOTE — Progress Notes (Signed)
ILR Remote 

## 2019-05-08 ENCOUNTER — Other Ambulatory Visit: Payer: Self-pay | Admitting: Physician Assistant

## 2019-05-15 LAB — CUP PACEART REMOTE DEVICE CHECK
Date Time Interrogation Session: 20210422003249
Implantable Pulse Generator Implant Date: 20190807

## 2019-05-16 ENCOUNTER — Ambulatory Visit (INDEPENDENT_AMBULATORY_CARE_PROVIDER_SITE_OTHER): Payer: 59 | Admitting: *Deleted

## 2019-05-16 DIAGNOSIS — I639 Cerebral infarction, unspecified: Secondary | ICD-10-CM

## 2019-05-16 NOTE — Progress Notes (Signed)
ILR Remote 

## 2019-05-31 ENCOUNTER — Other Ambulatory Visit: Payer: Self-pay | Admitting: Physician Assistant

## 2019-06-16 LAB — CUP PACEART REMOTE DEVICE CHECK
Date Time Interrogation Session: 20210523021109
Implantable Pulse Generator Implant Date: 20190807

## 2019-06-18 ENCOUNTER — Ambulatory Visit (INDEPENDENT_AMBULATORY_CARE_PROVIDER_SITE_OTHER): Payer: 59 | Admitting: *Deleted

## 2019-06-18 DIAGNOSIS — I639 Cerebral infarction, unspecified: Secondary | ICD-10-CM

## 2019-06-19 NOTE — Progress Notes (Signed)
Carelink Summary Report / Loop Recorder 

## 2019-07-21 ENCOUNTER — Ambulatory Visit (INDEPENDENT_AMBULATORY_CARE_PROVIDER_SITE_OTHER): Payer: 59 | Admitting: *Deleted

## 2019-07-21 DIAGNOSIS — I639 Cerebral infarction, unspecified: Secondary | ICD-10-CM | POA: Diagnosis not present

## 2019-07-21 LAB — CUP PACEART REMOTE DEVICE CHECK
Date Time Interrogation Session: 20210627233517
Implantable Pulse Generator Implant Date: 20190807

## 2019-07-22 NOTE — Progress Notes (Signed)
Carelink Summary Report / Loop Recorder 

## 2019-08-25 ENCOUNTER — Ambulatory Visit (INDEPENDENT_AMBULATORY_CARE_PROVIDER_SITE_OTHER): Payer: 59 | Admitting: *Deleted

## 2019-08-25 DIAGNOSIS — I639 Cerebral infarction, unspecified: Secondary | ICD-10-CM | POA: Diagnosis not present

## 2019-08-26 LAB — CUP PACEART REMOTE DEVICE CHECK
Date Time Interrogation Session: 20210730233901
Implantable Pulse Generator Implant Date: 20190807

## 2019-08-27 NOTE — Progress Notes (Signed)
Carelink Summary Report / Loop Recorder 

## 2019-09-25 ENCOUNTER — Ambulatory Visit (INDEPENDENT_AMBULATORY_CARE_PROVIDER_SITE_OTHER): Payer: 59 | Admitting: *Deleted

## 2019-09-25 DIAGNOSIS — I639 Cerebral infarction, unspecified: Secondary | ICD-10-CM

## 2019-09-25 LAB — CUP PACEART REMOTE DEVICE CHECK
Date Time Interrogation Session: 20210901234253
Implantable Pulse Generator Implant Date: 20190807

## 2019-09-26 NOTE — Progress Notes (Signed)
Carelink Summary Report / Loop Recorder 

## 2019-10-15 ENCOUNTER — Encounter: Payer: 59 | Admitting: Internal Medicine

## 2019-10-22 ENCOUNTER — Encounter: Payer: Self-pay | Admitting: Internal Medicine

## 2019-10-22 ENCOUNTER — Ambulatory Visit (INDEPENDENT_AMBULATORY_CARE_PROVIDER_SITE_OTHER): Payer: 59 | Admitting: Internal Medicine

## 2019-10-22 ENCOUNTER — Other Ambulatory Visit: Payer: Self-pay

## 2019-10-22 VITALS — BP 102/76 | HR 72 | Ht 70.0 in | Wt 187.2 lb

## 2019-10-22 DIAGNOSIS — N280 Ischemia and infarction of kidney: Secondary | ICD-10-CM | POA: Diagnosis not present

## 2019-10-22 DIAGNOSIS — I10 Essential (primary) hypertension: Secondary | ICD-10-CM

## 2019-10-22 HISTORY — PX: LOOP RECORDER REMOVAL: EP1215

## 2019-10-22 NOTE — Progress Notes (Signed)
PCP: [name], MD   Primary EP: Dr Thompson Grayer Glen Rollins is a 49 y.o. male who presents today for routine electrophysiology followup.  Since last being seen in our clinic, the patient reports doing very well.  Today, he denies symptoms of palpitations, chest pain, shortness of breath,  lower extremity edema, dizziness, presyncope, or syncope.  The patient is otherwise without complaint today.   Past Medical History:  Diagnosis Date  . COVID-19   . Elevated blood pressure reading   . Renal infarction [facility])    Past Surgical History:  Procedure Laterality Date  . LOOP RECORDER INSERTION N/A 08/29/2017   Procedure: LOOP RECORDER INSERTION;  Surgeon: Thompson Grayer, MD;  Location: [facility];  Service: Cardiovascular;  Laterality: N/A;  . SHOULDER SURGERY    . TEE WITHOUT CARDIOVERSION N/A 08/29/2017   Procedure: TRANSESOPHAGEAL ECHOCARDIOGRAM (TEE) Bubble Study;  Surgeon: Lelon Perla, MD;  Location: [facility] ENDOSCOPY;  Service: Cardiovascular;  Laterality: N/A;    ROS- all systems are reviewed and negatives except as per HPI above  Current Outpatient Medications  Medication Sig Dispense Refill  . aspirin EC 81 MG tablet Take 81 mg by mouth daily. Swallow whole.    . lisinopril (ZESTRIL) 5 MG tablet Take 1 tablet (5 mg total) by mouth daily. Please schedule annual appt for refills. [phone]. 1st attempt. 30 tablet 0  . XARELTO 20 MG TABS tablet Take 20 mg by mouth daily with supper.      No current facility-administered medications for this visit.    Physical Exam: Vitals:   10/22/19 1029  BP: 102/76  Pulse: 72  SpO2: 98%  Weight: 187 lb 3.2 oz (84.9 kg)  Height: 5\' 10"  (1.778 m)    GEN- The patient is well appearing, alert and oriented x 3 today.   Head- normocephalic, atraumatic Eyes-  Sclera clear, conjunctiva pink Ears- hearing intact Oropharynx- clear Lungs- Clear to ausculation bilaterally, normal work of breathing Heart- Regular rate and  rhythm, no murmurs, rubs or gallops, PMI not laterally displaced GI- soft, NT, ND, + BS Extremities- no clubbing, cyanosis, or edema  Wt Readings from Last 3 Encounters:  10/22/19 187 lb 3.2 oz (84.9 kg)  01/01/18 198 lb (89.8 kg)  08/23/17 193 lb (87.5 kg)    EKG tracing ordered today is personally reviewed and shows sinus  Assessment and Plan:  1. Systemic embolism S/p prior ILR He has been monitored for 2 years, with no arrhythmias observed He wishes to have his ILR removed. Risks and benefits to ILR removal were discussed at length with the patient who wishes to proceed. He remains on ASA.  He is followed closely by hematology who stopped his xarelto.  2. HTN Stable No change required today   Risks, benefits and potential toxicities for medications prescribed and/or refilled reviewed with patient today.   Return as needed  Thompson Grayer MD, [facility] 10/22/2019 10:47 AM     PROCEDURES:   1. Implantable loop recorder explantation       DESCRIPTION OF PROCEDURE:  Informed written consent was obtained.  The patient required no sedation for the procedure today.   The patients left chest was therefore prepped and draped in the usual sterile fashion.  The skin overlying the ILR monitor was infiltrated with lidocaine for local analgesia.  A 0.5-cm incision was made over the site.  The previously implanted ILR was exposed and removed using a combination of sharp and blunt dissection.  Steri- Strips  and a sterile dressing were then applied. EBL<49ml.  There were no early apparent complications.     CONCLUSIONS:   1. Successful explantation of a [employer] Reveal LINQ implantable loop recorder   2. No early apparent complications.        Thompson Grayer MD, [facility] 10/22/2019 11:22 AM

## 2019-10-22 NOTE — Patient Instructions (Signed)
Medication Instructions:  Your physician recommends that you continue on your current medications as directed. Please refer to the Current Medication list given to you today.  Labwork: None ordered.  Testing/Procedures: None ordered.   Your physician wants you to follow-up in: as needed with Dr. Rayann Heman.     Implantable Loop Recorder Removal, Care After This sheet gives you information about how to care for yourself after your procedure. Your health care provider may also give you more specific instructions. If you have problems or questions, contact your health care provider. What can I expect after the procedure? After the procedure, it is common to have:  Soreness or discomfort near the incision.  Some swelling or bruising near the incision.  Follow these instructions at home: Incision care  1.  Leave your outer dressing on for 24 hours.  After 24 hours you can remove your outer dressing and shower. 2. Leave adhesive strips in place. These skin closures may need to stay in place for 1-2 weeks. If adhesive strip edges start to loosen and curl up, you may trim the loose edges.  You may remove the strips if they have not fallen off after 2 weeks. 3. Check your incision area every day for signs of infection. Check for: a. Redness, swelling, or pain. b. Fluid or blood. c. Warmth. d. Pus or a bad smell. 4. Do not take baths, swim, or use a hot tub until your incision is completely healed. 5. If your wound site starts to bleed apply pressure.      If you have any questions/concerns please call the device clinic at [phone].  Activity  Return to your normal activities.  Contact a health care provider if:  You have redness, swelling, or pain around your incision.  You have a fever.

## 2019-10-28 ENCOUNTER — Ambulatory Visit (INDEPENDENT_AMBULATORY_CARE_PROVIDER_SITE_OTHER): Payer: 59

## 2019-10-28 DIAGNOSIS — I639 Cerebral infarction, unspecified: Secondary | ICD-10-CM

## 2019-10-28 LAB — CUP PACEART REMOTE DEVICE CHECK
Date Time Interrogation Session: 20211004234456
Implantable Pulse Generator Implant Date: 20190807

## 2019-10-31 NOTE — Progress Notes (Signed)
Carelink Summary Report / Loop Recorder 

## 2020-04-08 IMAGING — CT CT HEAD W/O CM
3 of 4 series · 13 of 47 positions shown, 15 images · non-contrast
Comparison: None.

CLINICAL DATA: Thunderclap headache

EXAM:
CT HEAD WITHOUT CONTRAST
TECHNIQUE: Contiguous axial images were obtained from the base of the skull
through the vertex without intravenous contrast.

[Series 3: head wo · axial · 0.45mm/px · z∈[-84,+36]mm · 7 of 33 slices shown, 9 images]
[im 5/33  brain]
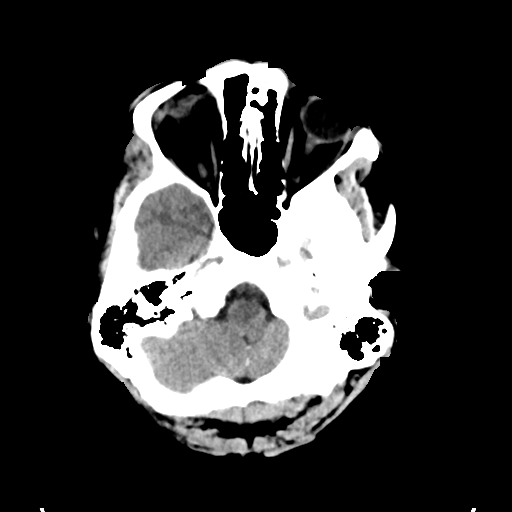
[im 5/33  bone]
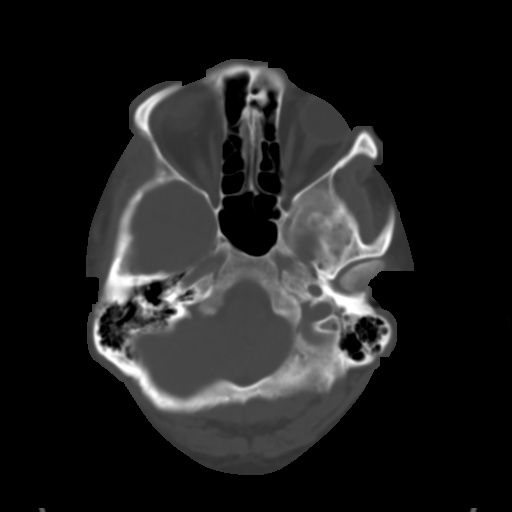
[im 9/33  brain]
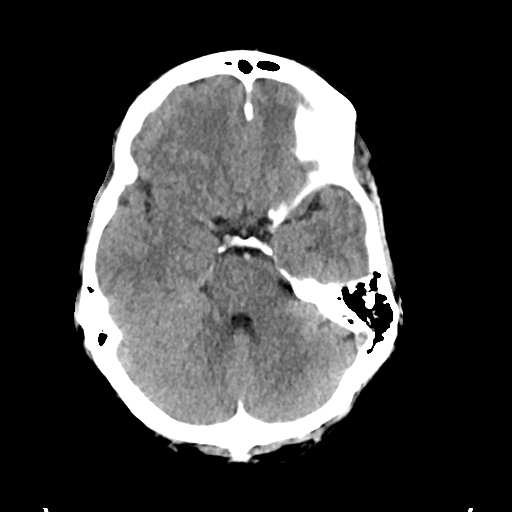
[im 13/33  brain]
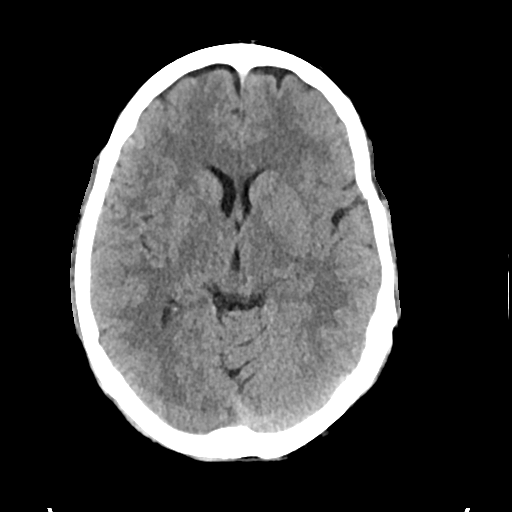
[im 17/33  brain]
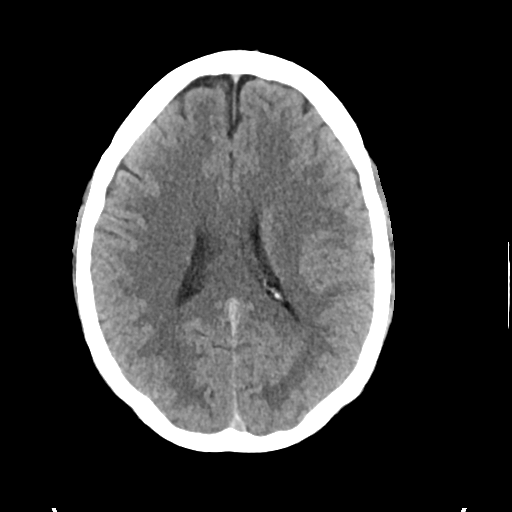
[im 21/33  brain]
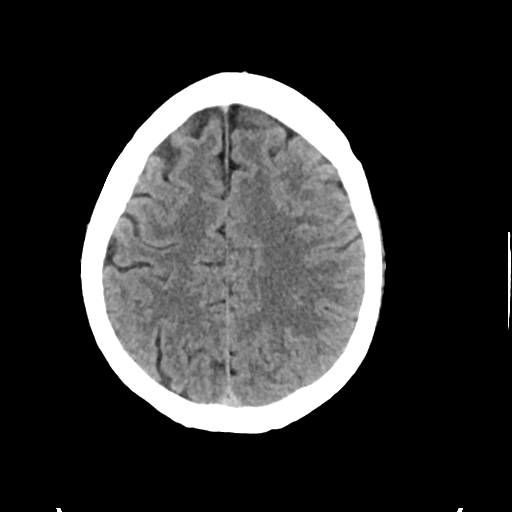
[im 21/33  bone]
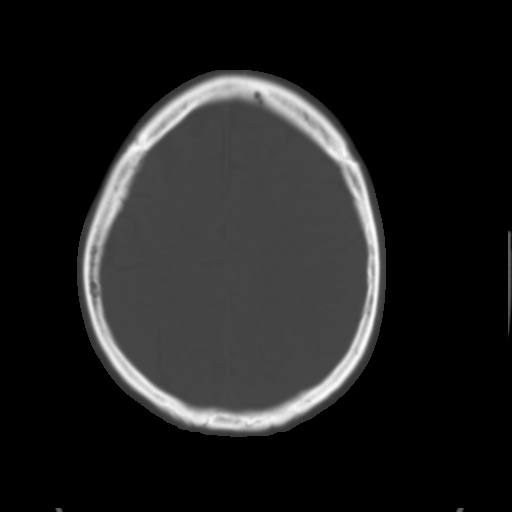
[im 25/33  brain]
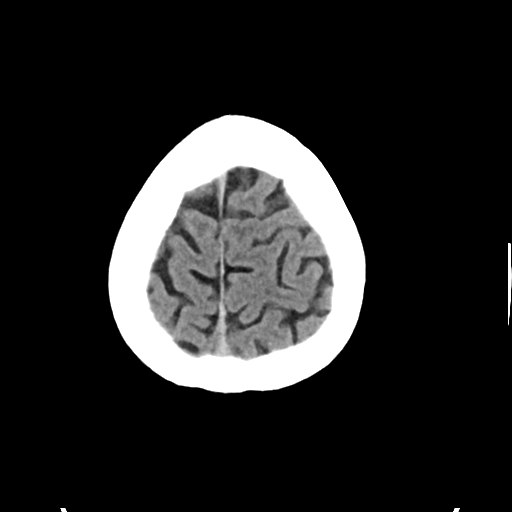
[im 29/33  brain]
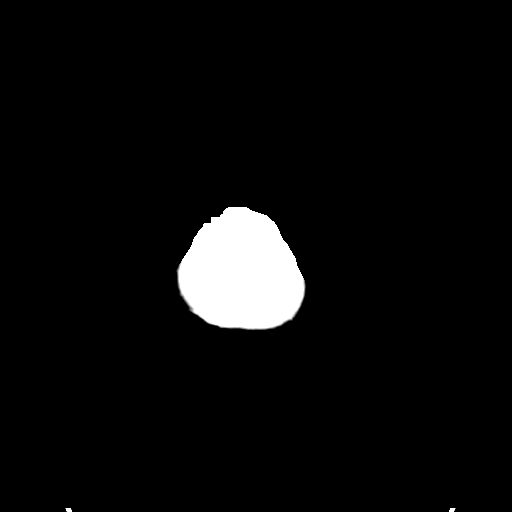

[Series 5: cor soft · coronal · 0.31mm/px · 3 of 78 slices shown]
[im 26/78  brain]
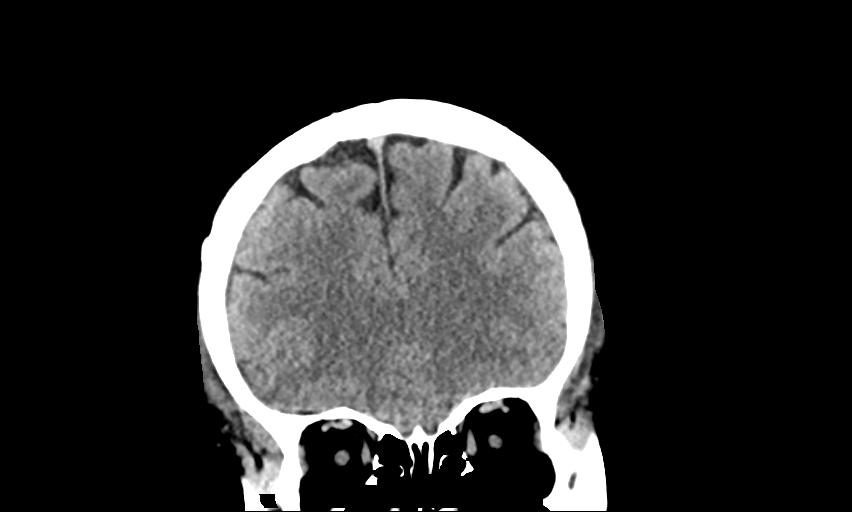
[im 35/78  brain]
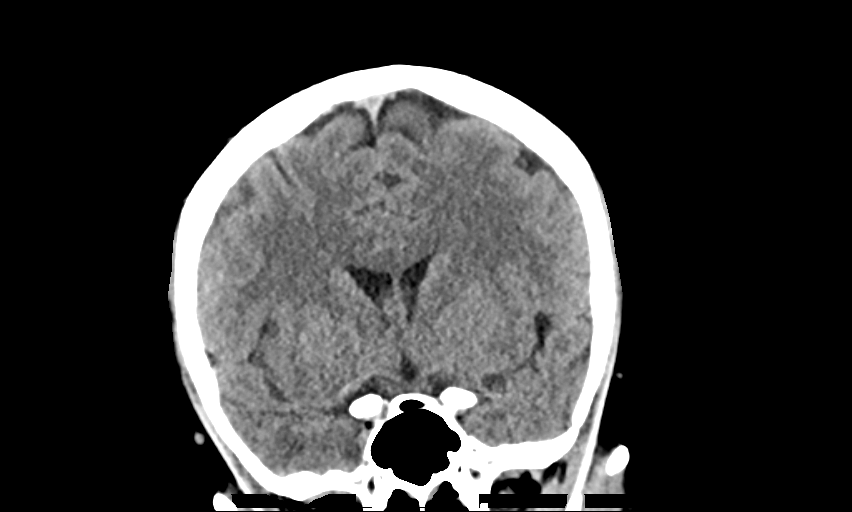
[im 43/78  brain]
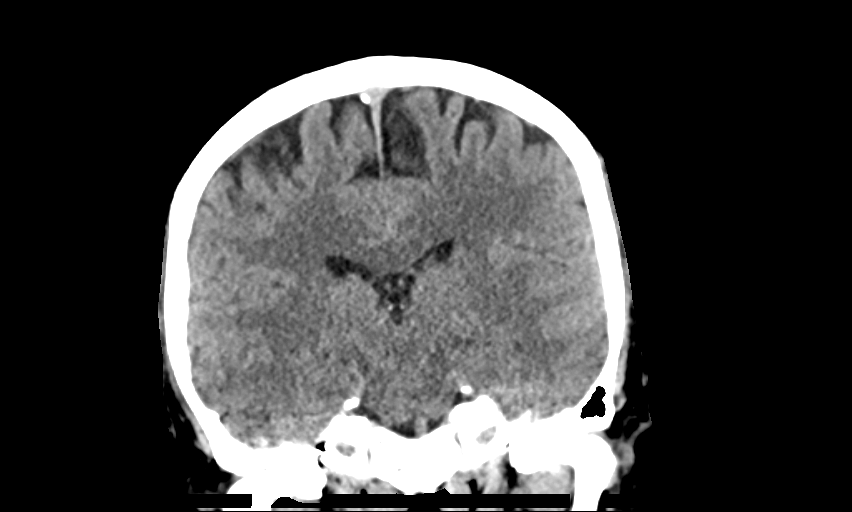

[Series 6: sag soft · sagittal · 0.31mm/px · 3 of 67 slices shown]
[im 23/67  brain]
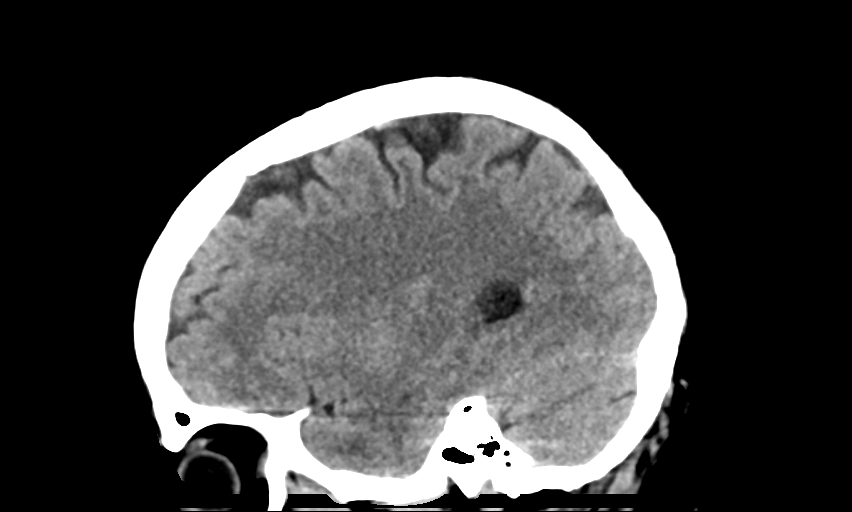
[im 34/67  brain]
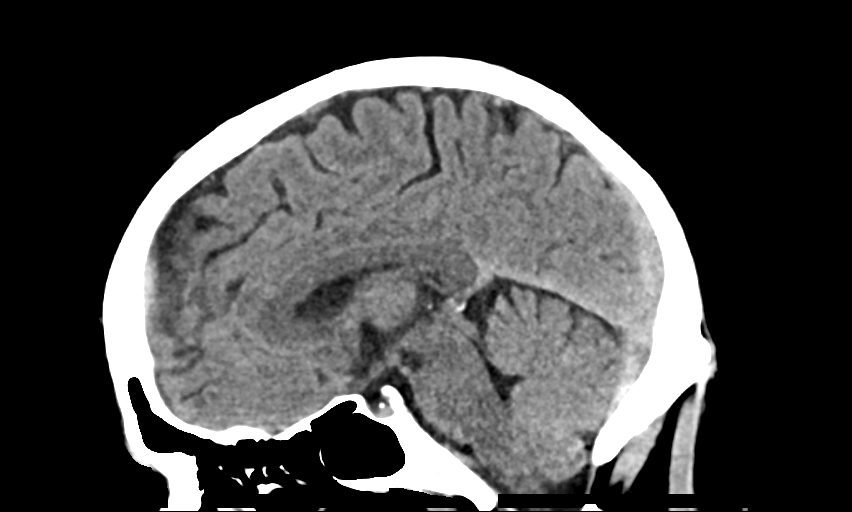
[im 45/67  brain]
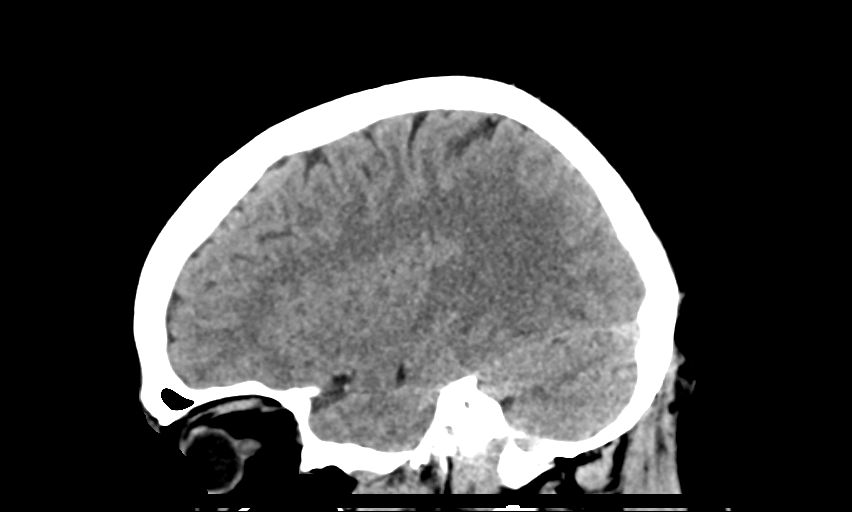

[13 of 47 positions shown; findings below may reference images not displayed]

FINDINGS: Brain: There is no mass, hemorrhage or extra-axial collection. The
size and configuration of the ventricles and extra-axial CSF spaces
are normal. There is no acute or chronic infarction. The brain
parenchyma is normal.

Vascular: No abnormal hyperdensity of the major intracranial
arteries or dural venous sinuses. No intracranial atherosclerosis.

Skull: The visualized skull base, calvarium and extracranial soft
tissues are normal.

Sinuses/Orbits: No fluid levels or advanced mucosal thickening of
the visualized paranasal sinuses. No mastoid or middle ear effusion.
The orbits are normal.
IMPRESSION: Normal head CT.

## 2022-03-17 ENCOUNTER — Other Ambulatory Visit: Payer: Self-pay | Admitting: Nephrology

## 2022-03-17 DIAGNOSIS — I129 Hypertensive chronic kidney disease with stage 1 through stage 4 chronic kidney disease, or unspecified chronic kidney disease: Secondary | ICD-10-CM

## 2022-03-17 DIAGNOSIS — N182 Chronic kidney disease, stage 2 (mild): Secondary | ICD-10-CM

## 2022-04-11 ENCOUNTER — Encounter (HOSPITAL_COMMUNITY): Payer: Self-pay | Admitting: Sports Medicine

## 2022-04-11 ENCOUNTER — Encounter (HOSPITAL_BASED_OUTPATIENT_CLINIC_OR_DEPARTMENT_OTHER): Payer: Self-pay | Admitting: Sports Medicine

## 2022-04-11 DIAGNOSIS — M25312 Other instability, left shoulder: Secondary | ICD-10-CM

## 2022-04-12 ENCOUNTER — Encounter (HOSPITAL_COMMUNITY): Payer: Self-pay | Admitting: Sports Medicine

## 2022-04-12 ENCOUNTER — Ambulatory Visit
Admission: RE | Admit: 2022-04-12 | Discharge: 2022-04-12 | Disposition: A | Discharge status: Home / Self Care | Payer: 59 | Source: Ambulatory Visit | Attending: Nephrology | Admitting: Nephrology

## 2022-04-12 DIAGNOSIS — M25312 Other instability, left shoulder: Secondary | ICD-10-CM

## 2022-04-12 DIAGNOSIS — I129 Hypertensive chronic kidney disease with stage 1 through stage 4 chronic kidney disease, or unspecified chronic kidney disease: Secondary | ICD-10-CM

## 2022-04-12 DIAGNOSIS — N182 Chronic kidney disease, stage 2 (mild): Secondary | ICD-10-CM

## 2022-04-13 ENCOUNTER — Other Ambulatory Visit (HOSPITAL_COMMUNITY): Payer: Self-pay | Admitting: Sports Medicine

## 2022-04-13 DIAGNOSIS — M25512 Pain in left shoulder: Secondary | ICD-10-CM

## 2022-04-13 DIAGNOSIS — M25312 Other instability, left shoulder: Secondary | ICD-10-CM

## 2022-04-20 ENCOUNTER — Ambulatory Visit (HOSPITAL_BASED_OUTPATIENT_CLINIC_OR_DEPARTMENT_OTHER)
Admission: RE | Admit: 2022-04-20 | Discharge: 2022-04-20 | Disposition: A | Discharge status: Home / Self Care | Payer: 59 | Source: Ambulatory Visit | Attending: Orthopaedic Surgery | Admitting: Orthopaedic Surgery

## 2022-04-20 DIAGNOSIS — M25312 Other instability, left shoulder: Secondary | ICD-10-CM | POA: Insufficient documentation

## 2022-04-20 DIAGNOSIS — M25512 Pain in left shoulder: Secondary | ICD-10-CM | POA: Insufficient documentation

## 2022-05-02 ENCOUNTER — Ambulatory Visit (HOSPITAL_COMMUNITY): Payer: 59
